# Patient Record
Sex: Male | Born: 1955 | Hispanic: Yes | Marital: Married
Health system: Western US, Academic
[De-identification: ages and names within clinical notes are randomized; demographics above are authoritative.]

---

## 2016-07-03 LAB — HLA ANTIBODY - CLASS II SINGLE

## 2016-07-03 LAB — HLA ANTIBODY - CLASS I SINGLE

## 2016-07-03 LAB — HLA ANTIBODY REPORT

## 2016-09-24 LAB — HLA ANTIBODY - CLASS II SINGLE

## 2016-09-24 LAB — HLA ANTIBODY - CLASS I SINGLE

## 2016-09-24 LAB — HLA ANTIBODY REPORT

## 2016-12-11 NOTE — Progress Notes (Addendum)
Call [facility] to obtain demographics and insurance info.  Need to fax request.  Attn:Darcy [phone].  Will request MG to obtain.    Appended Date: 12/11/2016        By: Retta Diones  ~~~~~~~~~~~~~~~~~~~~~~~~~  ~~~~~~~~~~~~~~~~~~~~~~~~~  ~~~~~~~~~~    ABO:O  Points: 9   List Date 07/19/08   cPRA: A54  EPTS: 87    0MM:9   Spec.  Programs: None    Will send Triage letter.  Electronically entered by: Retta Diones on 12/11/2016 2:52:00 PM

## 2016-12-19 NOTE — Progress Notes (Signed)
Faxed triage letter to Dr. Bridgett Larsson, Donnal Moat, and sent to patient's home  address in [place] and [place].  Electronically entered by: Cindi Carbon on 12/19/2016 3:01:00 PM

## 2016-12-23 NOTE — Progress Notes (Signed)
Received call from [facility] from Dr. Lianne Moris office stating they received the  triage letter and faxed it to the unit so patient will get the letter. She  informed us that patient's phone number is currently not work  ing, but we can contact his daughter at [phone] (she does not have the  daughter's name). Will update info.  Electronically entered by: Cindi Carbon on 12/23/2016 8:50:00 AM

## 2016-12-29 LAB — HLA ANTIBODY - CLASS II SINGLE

## 2016-12-29 LAB — HLA ANTIBODY - CLASS I SINGLE

## 2016-12-29 LAB — HLA ANTIBODY REPORT

## 2017-02-24 NOTE — Progress Notes (Signed)
Verify phone #s and address is still current (for changes): Same address  and cell number only    Appended Date: 02/24/2017        By: Cindi Carbon  ~~~~~~~~~~~~~~~~~~~~~~~~~  ~~~~~~~~~~~~~~~~~~~~~~~~~  ~~~~~~~~~~    Pref. Language: Spanish    An interpreter was utilized for this encounter.  Name of interpreter:  Brayton Layman PS#[id number]  Relationship: [employer]  Language: [place] phone #s and address is still current (for changes)    Are you still interested in moving forward with organ William Sherman? Yes    Current Physician Information    PCP: Dr. Elza Rafter, [place], [place]    GI:  no colo    Nephrologists: Dr.  Randa Lynn    Dialysis Unit:  [facility], Th, Sat    Cardiologist: none    Misc. Specialist:     Recent Medical History or Changes:    Current Weight: 210 lbs. Current Height: 5'8??    Recent Hospitalizations (When / Where): none    Recent ER Visits (When Raelene Bott): none    Additional / Worsening Dx or Symptoms:    Current Insurance (Company, ID#, Group #): Medicare, Medi-cal    Email Address: none    Date/Time of Phone Consult with Rollene Fare: 02/25/17 @ 9 am    Start Collecting Records:      Electronically entered by: Cindi Carbon on 02/24/2017 3:07:00 PM

## 2017-02-25 NOTE — Progress Notes (Signed)
Using [employer] interpreter Lollie Marrow [phone] spoke to pt to  update/review med hx.  Pt was not very knowledgeable of med hx.  He stated  he ??Almost had a stroke last month.??  He denied being hospita  lized for CVA.  He denied residual from CVA.  Denied any hospitalizations  in past 2-3 yrs.  He stated he is not very active due to decreased vision  and foot pain.  He stated he started PT  about 1 month ago for  foot pain. He goes to a PT center 1x/week.  Explained will change status to  Inactive due to temp too sick until records are retrieved and reviewed to  determine disposition.   Explained that his disposition cou  ld go 1 of 3 ways: 1) Re-See appt to determine functionality/disposition,  2) Obtain updated testing (DDWL eval testing) then Re-See appt or 3) Pt no  longer Lorrinda Ramstad candidate and will be removed from list. Ex  plained will retrieve/review med records to determine disposition. At same  time will have insurance verified/update. Explained if Medicare primary  will need to do DDWL eval testing at [facility]. He verbalized unders  tanding of info discussed.  Plan: Records retrieval/review, memo data center to change status to  Inactive due to code #7, temp too sick, draft Inactive letter.      ABO: O   Points: 9     cPRA: A54     EPTS: 57      0MM: 9   List Date: 07/19/08      Special Program:   None   HEight: 5'8??  200 lbs  BMI: 30  ESRD r/t: DM II  Medicare/Medi-Cal  Dialysis:  FDOD: 07/19/08  _X__Hemo     ___PD  - Access: A-V fistula  RUE  - Issues with infections: Denies  - Issues with Wt gain: Denies     CV:  __HTN: Denies  Cardiologist: Denies  __ CH: Denies                                                __Echo:  Unknown  __ High Cholesterol: Unsure      __ Stress: ??  __MI/CVA: Denies MI/CABG/CP/Stentitng  ? CVA last month, denies residual  __ Cardiac cath: ??    Pulm: __ COPD:Denies  __ SOB: Only in enclosed places   __ PPD+/TB: Denies  __ URIs: Denies    GI: __ GERD: Denies        __  Colonoscopy: Never  __ Chronic Constipation/Diarrhea: Denies  GU: __ Freq UTIs:Denies       __ Stones: Denies  __ Urine output: Small amt  __ Prostate issues: Denies  __ CT Abd/Pelvic: Unsure    Endo: __ DM: Diagnosed 1992          - Diet/Oral/Insulin: Currently pills and Insulin   - Vision: Blind in 1 eye, laser surgery other eye  - Neuropathy: In feet  - Gastroparesis: Denies    Malignancy history:  Denies    Psych:  Denies psychiatrist/therapist/counselor  __ Anxiety/Depression: Denies  __ Medications: Denies    Functional Status:   __ Distance able to walk: 20 minutes 2-3x/week  - Pain in legs/Claudication: In feet  - SOB: Denies  __ Activity level: Not very active due decreased vision  - Employed: Disability, Artist  -  Daily exercise: has PT x1/week  - Housework: Unable to do due to decreased vision  __ Assistive device: cane    Substance Use:    __ Tobacco: Never  __ ETOH: Not since starting dialysis  __ Marijuana/Cocaine/IV drug use: Denies      Support:  __ Single/Married: Married  __ Children: 5 ages 45-27  __ Living arrangements: With wife and children  __ Transportation: Several family members for transportation and back up  support    Health Status:  __ Recent hospitalizations: Denies any in past 2-3 yrs  __ Surgical history: A-V fistula insertion, head injury     Medications:   Allopurinol  Insulin  Lipitor  ASA 81 mg  Renvela        Plan of Care:  __ Medical records request: Harlene Salts to obtain records  __ Further testing/procedures (letter)Draft Inactive letter   __ Disposition: Records retrieval/review, memo data center to change status  to Inactive due to code #7, temp too sick, draft Inactive letter.    Electronically entered by: Retta Diones on 02/25/2017 3:22:00 PM

## 2017-02-25 NOTE — Progress Notes (Signed)
Updated status in [place] in inactive.  Electronically entered by: Berlinda Last on 02/25/2017 3:33:00 PM

## 2017-02-25 NOTE — Progress Notes (Signed)
Please change status to Inactive code #7, temp too sick.  Thanks  Electronically entered by: Retta Diones on 02/25/2017 3:28:00 PM

## 2017-03-04 NOTE — Progress Notes (Signed)
Obtain D/C summaries and ED progress notes from [facility]  from the past 3 years including cxr, ekg, and brain CT.  Obtained dialysis  flow sheets and current labs, uploaded in [place]. Notified RR  G.     Electronically entered by: Cindi Carbon on 03/04/2017 12:44:00 PM

## 2017-03-10 NOTE — Progress Notes (Signed)
Reviewed:  D/C summaries and ED progress notes from [facility]   CXR  EKG  Brain CT 11/09/16   DXU flow sheets and labs.   Have requested MRI Brain 11/06/16.    Plan: Review with Dr. Titus Dubin to determine disposition.     Electronically entered by: Retta Diones on 03/10/2017 3:20:00 PM

## 2017-03-17 NOTE — Progress Notes (Signed)
Reviewed: brain MRIs and neck MRI from 11/04/16 and brain CT from 10/31/16.    Plan: Review with Dr. Titus Dubin to determine disposition.  Electronically entered by: Retta Diones on 03/17/2017 9:38:00 AM

## 2017-03-17 NOTE — Progress Notes (Signed)
Uploaded brain MRIs and neck MRI from 11/04/16 and brain CT from 10/31/16.  Per medical records, they sent all the MRIs they had during the admission.  Notified RRG.  Electronically entered by: Cindi Carbon on 03/17/2017 9:22:00 AM

## 2017-04-02 NOTE — Progress Notes (Signed)
Reviewed case with Dr. Titus Dubin.  Reviewed D/C summary 11/06/16: CVA thrombosis R middle cerebral artery MRI:  Subacute Thrombosis R middle cerebral artery, Neck MRI: Possible stenosis  505 ICA. Pt has PT outpt 1x/week.  Pt not very active due to  decreased vision and foot pain.  Per Dr. Titus Dubin pt needs Re-See appt to  evaluate functionality and overall candidacy.    Plan: Call pt to inform need for Re-See appt and pass to primary  coordinator to arrange Re-See appt.  Electronically entered by: Retta Diones on 04/02/2017 3:13:00 PM
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
## 2018-09-16 NOTE — Progress Notes (Signed)
Able to speak with dialysis unit social worker, Acupuncturist. Informed Human resources officer reviewed patient's records and requested updates regarding patient's  interest in transplant. Per Salomon Fick, patient is still  interested. Patient is adherent to dialysis treatment and continues to have  a strong family support through his wife and children. Salomon Fick mentioned that  he patient is still able to ambulate with out assistance.  Confirmed that number on file is still current.    Contacted patient's number and spoke with [facility], patient's son, who reported that patient is not home at this time. Requested for [facility] for patient to call writer back  contact information communicated  to [facility].    An interpreter was utilized for this encounter.  Name of interpreter: Inge Rise #[id number]  Relationship: Phone interpreter  Language: Spanish    PLAN: Await for call back from the patient to complete phone triage  appointment.  Electronically entered by: [name] on 09/16/2018 10:45:00 AM

## 2019-03-21 NOTE — Progress Notes (Addendum)
Late entry from 03/23/2019:    Attempted to contact patient via listed number and reached their voicemail.  Message was left requesting for a call back to complete triage call.    Appended Date: 03/24/2019 08:08 AM        By: [name]  ~~~~~~~~~~~~~~~~~~~~~~~~~  ~~~~~~~~~~~~~~~~~~~~~~~~~  ~~~~~~~~~~    Contacted the patient via listed number and spoke with patient's son, William Sherman. Informed him that writer is calling in regards to his dad and if  patient continues to be interested in transplantation. [name] verbalized that the patient is still interested and agreed to complete  telephone triage interview on Wednesday, 03/23/19, at 2pm. Requested for  patient to be present on the call as well as family member  who is most familiar with patient's medical appointments and information.  Writer will have Spanish interpreter present during the call.    PLAN: Triage call on Wednesday, 03/23/19 at 2pm.  Electronically entered by: [name] on 03/21/2019 4:11:00 PM

## 2019-11-21 DIAGNOSIS — I69998 Other sequelae following unspecified cerebrovascular disease: Secondary | ICD-10-CM

## 2019-11-21 DIAGNOSIS — M868X8 Other osteomyelitis, other site: Secondary | ICD-10-CM

## 2019-11-21 DIAGNOSIS — Z9911 Dependence on respirator [ventilator] status: Secondary | ICD-10-CM

## 2019-11-21 DIAGNOSIS — Z992 Dependence on renal dialysis: Secondary | ICD-10-CM

## 2019-11-21 DIAGNOSIS — I120 Hypertensive chronic kidney disease with stage 5 chronic kidney disease or end stage renal disease: Secondary | ICD-10-CM

## 2019-11-21 DIAGNOSIS — H547 Unspecified visual loss: Secondary | ICD-10-CM

## 2019-11-21 DIAGNOSIS — Z20822 Contact with and (suspected) exposure to covid-19: Secondary | ICD-10-CM

## 2019-11-21 DIAGNOSIS — I953 Hypotension of hemodialysis: Secondary | ICD-10-CM

## 2019-11-21 DIAGNOSIS — Z515 Encounter for palliative care: Secondary | ICD-10-CM

## 2019-11-21 DIAGNOSIS — E1169 Type 2 diabetes mellitus with other specified complication: Secondary | ICD-10-CM

## 2019-11-21 DIAGNOSIS — K148 Other diseases of tongue: Secondary | ICD-10-CM

## 2019-11-21 DIAGNOSIS — R040 Epistaxis: Secondary | ICD-10-CM

## 2019-11-21 DIAGNOSIS — Z79899 Other long term (current) drug therapy: Secondary | ICD-10-CM

## 2019-11-21 DIAGNOSIS — C770 Secondary and unspecified malignant neoplasm of lymph nodes of head, face and neck: Secondary | ICD-10-CM

## 2019-11-21 DIAGNOSIS — Z4682 Encounter for fitting and adjustment of non-vascular catheter: Secondary | ICD-10-CM

## 2019-11-21 DIAGNOSIS — J9811 Atelectasis: Secondary | ICD-10-CM

## 2019-11-21 DIAGNOSIS — E785 Hyperlipidemia, unspecified: Secondary | ICD-10-CM

## 2019-11-21 DIAGNOSIS — D62 Acute posthemorrhagic anemia: Secondary | ICD-10-CM

## 2019-11-21 DIAGNOSIS — E8770 Fluid overload, unspecified: Secondary | ICD-10-CM

## 2019-11-21 DIAGNOSIS — E1122 Type 2 diabetes mellitus with diabetic chronic kidney disease: Secondary | ICD-10-CM

## 2019-11-21 DIAGNOSIS — D491 Neoplasm of unspecified behavior of respiratory system: Secondary | ICD-10-CM

## 2019-11-21 DIAGNOSIS — R5383 Other fatigue: Secondary | ICD-10-CM

## 2019-11-21 DIAGNOSIS — E875 Hyperkalemia: Secondary | ICD-10-CM

## 2019-11-21 DIAGNOSIS — G510 Bell's palsy: Secondary | ICD-10-CM

## 2019-11-21 DIAGNOSIS — H9190 Unspecified hearing loss, unspecified ear: Secondary | ICD-10-CM

## 2019-11-21 DIAGNOSIS — Z7984 Long term (current) use of oral hypoglycemic drugs: Secondary | ICD-10-CM

## 2019-11-21 DIAGNOSIS — K219 Gastro-esophageal reflux disease without esophagitis: Secondary | ICD-10-CM

## 2019-11-21 DIAGNOSIS — F419 Anxiety disorder, unspecified: Secondary | ICD-10-CM

## 2019-11-21 MED ORDER — WHITE PETROLATUM-MINERAL OIL EYE OINTMENT (~~LOC~~ WRAPPER)
OPHTHALMIC | Status: DC
  Administered 2019-11-22 – 2019-11-29 (×20): 1 via OPHTHALMIC

## 2019-11-21 MED ORDER — PROPOFOL 10 MG/ML INTRAVENOUS EMULSION: 10 mg/mL | INTRAVENOUS | Status: DC

## 2019-11-21 MED ORDER — PROPOFOL 10 MG/ML INTRAVENOUS EMULSION
10 mg/mL | INTRAVENOUS | Status: DC
  Administered 2019-11-22 (×3): 30 ug/kg/min via INTRAVENOUS
  Administered 2019-11-23: 08:00:00 10 ug/kg/min via INTRAVENOUS
  Administered 2019-11-24: 15 ug/kg/min via INTRAVENOUS
  Administered 2019-11-24: 12:00:00 5 ug/kg/min via INTRAVENOUS
  Administered 2019-11-25: 17:00:00 50 ug/kg/min via INTRAVENOUS
  Administered 2019-11-25: 02:00:00 20 ug/kg/min via INTRAVENOUS
  Administered 2019-11-25: 22:00:00 30 ug/kg/min via INTRAVENOUS
  Administered 2019-11-25: 09:00:00 20 ug/kg/min via INTRAVENOUS
  Administered 2019-11-26: 07:00:00 40 ug/kg/min via INTRAVENOUS
  Administered 2019-11-26 – 2019-11-28 (×3): 30 ug/kg/min via INTRAVENOUS
  Administered 2019-11-28 (×2): 40 ug/kg/min via INTRAVENOUS
  Administered 2019-11-29 – 2019-11-30 (×5): 20 ug/kg/min via INTRAVENOUS
  Administered 2019-11-30: 17:00:00 30 ug/kg/min via INTRAVENOUS
  Administered 2019-11-30: 03:00:00 20 ug/kg/min via INTRAVENOUS

## 2019-11-21 MED ORDER — FENTANYL (PF) 10 MCG/ML IN 0.9 % SODIUM CHLORIDE INTRAVENOUS
2,500 mcg/250 mL (10 mcg/mL) | INTRAVENOUS | Status: DC
  Administered 2019-11-22: 06:00:00 100 ug/h via INTRAVENOUS

## 2019-11-21 MED ORDER — PROPOFOL 10 MG/ML INTRAVENOUS EMULSION
10 | INTRAVENOUS | Status: AC
Start: 2019-11-21 — End: 2019-11-21
  Administered 2019-11-22: 05:00:00 5 via INTRAVENOUS

## 2019-11-21 MED ORDER — SODIUM CHLORIDE 0.9 % (FLUSH) INJECTION SYRINGE
0.9 % | INTRAMUSCULAR | Status: DC | PRN
  Administered 2019-12-04: 11:00:00 3 mL via INTRAVENOUS

## 2019-11-21 MED ORDER — LIDOCAINE (PF) 10 MG/ML (1 %) INJECTION SOLUTION: 10 mg/mL (1 %) | INTRAMUSCULAR | Status: DC | PRN

## 2019-11-21 MED ORDER — SODIUM CHLORIDE 0.9 % (FLUSH) INJECTION SYRINGE
0.9 % | INTRAMUSCULAR | Status: DC
  Administered 2019-11-22 – 2019-12-05 (×37): 3 mL via INTRAVENOUS

## 2019-11-21 NOTE — ED Notes (Signed)
CU with Helene Kelp, RN.  Requested repeat ABG per Marlowe Sax, ICU triage fellow.   11/21/19 1637   Pre Arrival Vitals   Pre-Hospital Pulse 80   Pre-Hospital BP 162/74   Pre-Hospital Resp 18   Pre-Hospital SpO2 100 %   FiO2 (%) 60 %   Pre-arrival ventilator settings   Vt (Set, mL) 420 mL   PEEP/CPAP (cm H2O) 7 cm H20   Pre Arrival Lines and Drains   Pre Arrival Vascular Access (Does not create LDA on flowsheet)   (corrected R/L EJ)   Pre Arrival Labs   Hemoglobin - Pre Arrival Lab 9   Hematocrit - Pre Arrival Lab 26.7

## 2019-11-21 NOTE — ED Notes (Signed)
Attempted clinical screen. RN not available, obtained initial data from referring facility documentation, MD report, and covering RN able to provide VS. Requested call back for updated clinical screen.     11/21/19 1259   Pre Arrival Vitals   Pre-Arrival Temp 37 C (98.6 F)   Pre-Hospital Pulse 78   Pre-Hospital BP 180/82   Pre-Hospital Resp 18   Pre-Hospital SpO2 100 %   O2 Device ETT   FiO2 (%) 80 %   Pre-Hospital Weight 79.3 kg (174 lb 13.2 oz)   Pain Level Unable to Assess (Comment)   Skin Color/Condition   Skin Color/Condition (WDL) X   Skin Color Pale   Pre arrival neuro status   Neuro (WDL) X  (HOH, blindness at baseline)   Level of Consciousness Other (Comment);Obtunded  (mentally obtunded upon arrival, now intubated)   Orientation Level Other (Comment)   Cognition UTA   Speech UTA;Other (Comment)  (non-verbal at baseline)   Best Verbal Response 1.   L Pupil Reaction Brisk  (PERRL, EOM intact per report)   R Pupil Reaction Brisk   RUE Motor Response (best voluntary) 1   LUE Motor Response (best voluntary) 1   RLE Motor Response (best voluntary) 1   LLE Motor Response (best voluntary) 1   Pre arrival respiratory assessment   Breathing Assisted   Pre-arrival ventilator settings   Is patient intubated? Yes   Vent Mode Assist control   Resp Rate (Set) 18   Vt (Set, mL) 450 mL   PEEP/CPAP (cm H2O) 8 cm H20   Pre Arrival Lines and Drains   Pre Arrival Vascular Access (Does not create LDA on flowsheet) Other (comment)  (A/V fistula x2, one fuctioning, 7.5 cm anterior posterior rapid Rhino in each nostril)   Pre Arrival Drains (does not create LDA on flowsheet) NG/OG tube   Pre Arrival Labs   Sodium - Pre Arrival Lab 138   Potassium- Pre Arrival Lab 3.9   Chloride - Pre Arrival Lab 93   CO2 - Pre Arrival Lab 31   BUN - Pre Arrival Lab 26   Creatinine - Pre Arrival Lab 6.3   Glucose - Pre Arrival Lab 158   Calcium - Pre Arrival Lab 10.3   Hemoglobin - Pre Arrival Lab 9.3   Hematocrit - Pre Arrival Lab 28.1   WBC-Pre  Arrival Lab 9.7   Platelets -Pre Arrival Lab 298   PT/INR -Pre Arrival Lab 13.5/1.3   AST - Pre Arrival Lab 16   ALT - Pre Arrival Lab 13   Bilirubin - Pre Arrival Lab 1.1   Alkaline Phosphate - Pre Arrival Lab 47   ABG - Pre Arrival Lab 7.46/51/61/36.3/11.2/96.6 on 100% Fio2   Troponin - Pre Arrival Lab 0.02   Anion Gap - Pre Arrival Lab 14   Pre Arrival Additional Info   Additonal Information COVID not detected   Allergies NKDA

## 2019-11-21 NOTE — ED Notes (Signed)
INITIAL ASSESSMENT OBTAINED FROM REFERRING HOSPITAL'S CLINICIAN(S)        Name of Clinician providing clinical information: Enid Derry, RN  Referring Facility: [facility] ED  Accepting Diagnosis: High Volume Epistaxis  Treatment Plan: IR consult, eval and treat  FRE:[id number] with severe nose bleed, started overnight, pt became lethargic unable to manage airway, was intubated  PMH: ESRD on HD, last Friday; taking eliquis for stroke years ago, blind, HOH, wheelchair bound, HTN    COVID: Asymptomatic, Tested Negative within last 96 hours at referring facility.  COVID results: Scanned into clinical documents.    Pre Arrival Additional Info  Additonal Information: COVID not detecteed  Allergies: NKDA  Devices/therapies/lines:PIV, OGT, Rhino rockets b/l nares  Mental Status:   Other  Pre arrival neuro status  Neuro (WDL): Exceptions to WDL (HOH, blindness at baseline)  Level of Consciousness: (P) Other (Comment), Obtunded (alert upon arrival then sedated for intubation, responded to painful stimuli during sedation-vacation)  Orientation Level: (P) Unable to assess  Cognition: Unable to assess  Speech: Unable to assess, Other (Comment) (non-verbal at baseline)  Best Eye Response: (P) None  Best Verbal Response: ETT/Trach tube present  L Pupil Reaction: Brisk (PERRL, EOM intact per report)  R Pupil Reaction: Brisk  RUE Motor Response (best voluntary): (P) Movement against gravity and some resistance  LUE Motor Response (best voluntary): (P) Movement against gravity and some resistance  RLE Motor Response (best voluntary): (P) Movement against gravity and some resistance  LLE Motor Response (best voluntary): (P) Movement against gravity and some resistance  Able to Ambulate?: (P) No (wheelchair at baseline per family)  Pre Arrival Vitals  Pre-Arrival Temp: 37 C (98.6 F)  Pre-Hospital Pulse: 78  Cardiac Rhythm: (P) Normal sinus rhythm  Pre-Hospital BP: (P) 173/79 (1 inch Nitropaste present)  Pre-Hospital Resp: 18   Pre-Hospital SpO2: 100 %  O2 Device: ETT  FiO2 (%): 80 %  Pre-Hospital Weight: 79.3 kg (174 lb 13.2 oz)  Pain Level: (P) Unable to Assess (Comment)     Pre arrival respiratory assessment  Breathing: Assisted  O2 Device: ETT  Additional Isolation Needs: (P)  (None)  Intubated: Pre-arrival ventilator settings  Is patient intubated?: Yes  Vent Mode: Assist control  FiO2 (%): 80 %  Resp Rate (Set): 18  Vt (Set, mL): 450 mL  PEEP/CPAP (cm H2O): (P) 6.5 cm H20    Infusions: Pre Arrival Medication Infusions  FentaNYL (SUBLIMAZE) Infusion: (P) _0 /hr  Propofol (DIPRIVAN) Infusion: (P) _1  mcg/kg/min  Other Medication Gtt #1: (P) received 1000 units of k-centra  Other Medication Gtt #2: (P) HX: eliquis- last 11/20/19 am  Is the patient currently on an IV/SQ Prostacyclin for Pulm HTN? ie. IV Epoprostenol (Flolan), IV Epoprostenol (Veletri), IV or SQ Treprostinil (Remodulin) : (P) No         Pre Arrival Labs  Sodium - Pre Arrival Lab: 138  Potassium- Pre Arrival Lab: 3.9  Chloride - Pre Arrival Lab: 93  CO2 - Pre Arrival Lab: 31  BUN - Pre Arrival Lab: 26  Creatinine - Pre Arrival Lab: 6.3  Glucose - Pre Arrival Lab: 158  Calcium - Pre Arrival Lab: 10.3  Hemoglobin - Pre Arrival Lab: 9.3  Hematocrit - Pre Arrival Lab: 28.1  WBC-Pre Arrival Lab: 9.7  Platelets -Pre Arrival Lab: 298  PT/INR -Pre Arrival Lab: 13.5/1.3  AST - Pre Arrival Lab: 16  ALT - Pre Arrival Lab: 13  Bilirubin - Pre Arrival Lab: 1.1  Alkaline Phosphate - Pre Arrival Lab: 47  ABG - Pre Arrival Lab: 7.46/51/61/36.3/11.2/96.6 on 100% Fio2  Troponin - Pre Arrival Lab: 0.02  Anion Gap - Pre Arrival Lab: 14  Other Lab Values-Pre Arrival Lab: (P) 1 unit blood given          11/21/19 1337   Pre Arrival Vitals   Cardiac Rhythm NSR   Pre-Hospital BP 173/79  (1 inch Nitropaste present)   Pain Level Unable to Assess (Comment)   Skin Color/Condition   Skin Condition/Temp Dry;Edematous;Other (Comment)  (scars on abdomen)   Pre arrival neuro status   Level of Consciousness  Other (Comment);Obtunded  (alert upon arrival then sedated for intubation, responded to painful stimuli during sedation-vacation)   Orientation Level UTA   Best Eye Response 1   RUE Motor Response (best voluntary) 4   LUE Motor Response (best voluntary) 4   RLE Motor Response (best voluntary) 4   LLE Motor Response (best voluntary) 4   Able to Ambulate? No  (wheelchair at baseline per family)   Pre-arrival ventilator settings   PEEP/CPAP (cm H2O) 6.5 cm H20   Pre Arrival Gastrointestinal Assessment   Gastrointestinal (WDL) X   Nausea N   Vomiting N   Diarrhea N   Pre Arrival Genitourinary Assessment   Genitourinary (WDL) X   Genitourinary Symptoms Anuric   Pre Arrival Lines and Drains   Pre Arrival Vascular Access (Does not create LDA on flowsheet) Peripheral IV  (functioning A/V fistula R upper arm, last HD Friday. PIV 18g B/L IJ)   Pre Arrival Drains (does not create LDA on flowsheet) NG/OG tube  (417m output dark red blood from OG since placement)   Pre Arrival Medication Infusions   FentaNYL (SUBLIMAZE) Infusion _0 /hr   Propofol (DIPRIVAN) Infusion _1  mcg/kg/min   Other Medication Gtt #1 received 1000 units of k-centra   Other Medication Gtt #2 HX: eliquis- last 11/20/19 am   Is the patient currently on an IV/SQ Prostacyclin for Pulm HTN? ie. IV Epoprostenol (Flolan), IV Epoprostenol (Veletri), IV or SQ Treprostinil (Remodulin)  No   C. Difficile Risk Assessment   Additional Isolation Needs   (None)   Pre Arrival Labs   Other Lab Values-Pre Arrival Lab 1 unit blood given

## 2019-11-21 NOTE — Consults (Signed)
OSH request  64 yo male with OSH ED, intubated for hight volume epistaxis (bilateral rhinorocket nasal packing ) h/h droped from 9-7, 1 unit, BP (high now, needs dialysis-last dialysis unknown) and HR stable, out pt (dr. 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 Imaging right nasal mass, right masticator space and into hypopharynx extension, vascular encasement  CT brain without contrast, CT angio head and neck with contrast.     Previous MRI and CT earlier in the month. Unable to upload any imaging due to limitations in system.     [place], [facility] ED provider currently taking care of pt at OSH ED: [phone].     Sign-out:   Stable, no more bleeding, packed bilaterally  Covid status negative.   When last dialysis- MWF, last know un  Imaging requested disk      Plan to accept to ICU given intubation status as long as beds. Will need CT-A with coordinate dialysis and possible neuro IR vs OR with [facility] (possible to do bedside biopsy pending exam). I spoke with Dr. Joneen Caraway, Christeen Douglas and Dr. Kenton Kingfisher about the plan.

## 2019-11-21 NOTE — Consults (Signed)
OTOLARYNGOLOGY - HEAD AND NECK SURGERY  History and Physical/Consult Note    Chief Complaint: high volume epistaxis    Requesting Provider: Donivan Scull MD    Requesting Service: ICU    History of Present Illness: William Sherman is a 64 y.o. 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 H/H dropped from 9 to 7 reported. CT brain wihtout contrast and CTA H&N obtained at OSH which notes right nasal mass with extension into the right masticator space and hypopharynx with vascular involvement. Outpt ENT is Dr. Landis Gandy. Reportedly blind and hard of hearing at baseline.     Review of Systems:  Unable to obtain complete ROS due to patient's acuity of condition.     --- For other relevant health data, including Allergies, Home medications, and Health history, please see addendum of note ---    Objective:  BP (!) 232/59   Pulse 83   Temp 37.7 C (99.9 F) (Axillary)   Resp 16   Ht 172.7 cm (5' 8")   Wt (S) 84.4 kg (186 lb 1.1 oz)   SpO2 98%   BMI 28.29 kg/m      Vent settings:  Mode: CMV (06/07 2200)  RR Set: 16 (06/07 2200)  Flow Rate Set (lpm): 50 (06/07 2200)  Tidal Volume Set: 400 mL (06/07 2200)  PEEP/CPAP Set (cm H2O): 5 cm H2O (06/07 2200)  FiO2 (%): 30 % (06/07 2236)      No intake or output data in the 24 hours ending 11/21/19 2256    Physical Exam:  General: Ill appearing man, intubated  Neurological:  sedated   Cardio/Pulmonary: mechanically ventilated  HEENT:      Head/Face: NCAT, no worrisome face lesions     Eyes: left pupil and iris opaque, right pupil and iris dark brown     Ears: External ears normal-appearing     Nose: External nose normal-appearing, packed bilaterally with rhinorockets     Oral cavity: 8.0 ETT in place      Neck: thick wide neck, supple, flat, trachea midline, no  cervical or supraclavicular lymphadenopathy      Neuro: sedated    Selected Results:  I have reviewed the following laboratory data --  No results found in last 72 hours  No results found in last 72 hours    No results found for: WBC, WBCM, WCBD, HGB, HGBM, HCT, HCTM, NSH, HCT22, MCV, PLT, CCP  No results found for: NA, K, CL, CO2, BUN, CREAT, GLU  No results found for: CA, PO4  No results found for: MG  No results found for: ALT, AST, ALKP, DBILI, TBILI, ANA4, TBILWB, GGT, ANA5  No results found for: INR, PT, LABPROT, PROTIME  No results found for: PTT  No results found for: CK, MBMU, TRPI, TRIPZ  No results found for: POCTLEUK, POCTNITRITE, POCTPROTEIN, POCTPHUR, POCTRBCUR, POCTSPECGRAV, POCTKETONESU, POCTBILIUR, POCTGLUCUR      Review of Other Relevant Data:  Radiology Results  No results found.    The plan regarding Gaje Tennyson was discussed with [facility], Drs. Caron Presume and Christeen Douglas.    Assessment and Plan:  Wisdom Rickey ([id number]) - 1356/1356-12  64 y.o. 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 At this time, unable to evaluate for source of bleeding with bilateral nasal packing. Will obtain recent/new imaging and consider biopsy based on results.    Recommend  - appreciate excellent global care per primary team  - if OSH CTA head and neck with contrast cannot be uploaded into [employer], will need to obtain new imaging. Will need to coordinate hemodialysis timing with repeat CTA given contrast load  - will consider Neuro IR consult pending results/findings of CTA  - will discuss removal of nasal packing in the AM with possible bedside vs OR biopsy to evaluate nasal mass  - we have reached out to his outpatient ENT Dr. Landis Gandy for further history regarding his  prior workup and care of the nasal mass  - [facility] will continue to follow. Please alert [facility] to any acute changes in clinical status.     Juanetta Snow MD  Resident Physician, PGY-2  Otolaryngology - Head & Neck Surgery  11/21/19    Code Status:   Full Code    --- Please page/call the Otolaryngology - Head & Neck Surgery Consult service pager with any questions ---      Additional Medical History    Medications Prior to Admission:  Scheduled Meds:   0.9% sodium chloride flush  3 mL Intravenous Q8H [place]    white petrolatum-mineral oil  1 Application Both Eyes 4x Daily [place]     Continuous Infusions:   fentaNYL 100 mcg/hr (11/21/19 2240)    propofoL       PRN Meds:   0.9% sodium chloride flush  3 mL Intravenous PRN    lidocaine (PF)  5 mL Subcutaneous PRN       Allergies:  Allergies/Contraindications  Not on File    Past Medical History:  No past medical history on file.    Past Surgical History:  No past surgical history on file.    Social History:  Social History     Socioeconomic History    Marital status: Married     Spouse name: Not on file    Number of children: Not on file    Years of education: Not on file    Highest education level: Not on file   Occupational History    Not on file   Tobacco Use    Smoking status: Not on file   Substance and Sexual Activity    Alcohol use: Not on file    Drug use: Not on file    Sexual activity: Not on file   Other Topics Concern    Not on file   Social History Narrative    Not on file     Social Determinants of Health     Financial Resource Strain:     Difficulty of Paying Living Expenses:    Food Insecurity:     Worried About [place] in the Last Year:     [place] in the Last Year:    Transportation Needs:     Film/video editor (Medical):     Lack of Transportation (Non-Medical):    Physical Activity:     Days of Exercise per Week:     Minutes of Exercise per Session:    Stress:     Feeling of Stress :    Social  Connections:     Frequency of Communication with Friends and Family:  Frequency of Social Gatherings with Friends and Family:     Attends Religious Services:     Active Member of Clubs or Organizations:     Attends Music therapist:     Marital Status:    Intimate Partner Violence:     Fear of Current or Ex-Partner:     Emotionally Abused:     Physically Abused:     Sexually Abused:       Social history was reviewed and is non-contributory to this illness.    Family History:     Family history was reviewed and is non-contributory to this illness.

## 2019-11-22 ENCOUNTER — Inpatient Hospital Stay: Admit: 2019-11-22 | Discharge: 2019-11-22 | Payer: MEDICARE

## 2019-11-22 ENCOUNTER — Inpatient Hospital Stay
Admission: TF | Admit: 2019-11-22 | Discharge: 2019-12-06 | Disposition: A | Discharge status: Skilled Nursing Facility | Payer: MEDICARE | Source: Intra-hospital

## 2019-11-22 DIAGNOSIS — Z452 Encounter for adjustment and management of vascular access device: Secondary | ICD-10-CM

## 2019-11-22 DIAGNOSIS — D4989 Neoplasm of unspecified behavior of other specified sites: Secondary | ICD-10-CM

## 2019-11-22 DIAGNOSIS — Z4659 Encounter for fitting and adjustment of other gastrointestinal appliance and device: Secondary | ICD-10-CM

## 2019-11-22 DIAGNOSIS — R58 Hemorrhage, not elsewhere classified: Secondary | ICD-10-CM

## 2019-11-22 DIAGNOSIS — I10 Essential (primary) hypertension: Secondary | ICD-10-CM

## 2019-11-22 LAB — VENOUS BLOOD GAS W/LACTATE
Base excess: 10 mmol/L
Base excess: 11 mmol/L
Base excess: 7 mmol/L
Base excess: 7 mmol/L
Base excess: 9 mmol/L
Bicarbonate: 31 mmol/L — ABNORMAL HIGH (ref 23–29)
Bicarbonate: 32 mmol/L — ABNORMAL HIGH (ref 23–29)
Bicarbonate: 33 mmol/L — ABNORMAL HIGH (ref 23–29)
Bicarbonate: 33 mmol/L — ABNORMAL HIGH (ref 23–29)
Bicarbonate: 34 mmol/L — ABNORMAL HIGH (ref 23–29)
Calcium, Ionized, whole blood: 1.28 mmol/L — ABNORMAL HIGH (ref 1.13–1.27)
Calcium, Ionized, whole blood: 1.28 mmol/L — ABNORMAL HIGH (ref 1.13–1.27)
Calcium, Ionized, whole blood: 1.29 mmol/L — ABNORMAL HIGH (ref 1.13–1.27)
Calcium, Ionized, whole blood: 1.3 mmol/L — ABNORMAL HIGH (ref 1.13–1.27)
Calcium, Ionized, whole blood: 1.32 mmol/L — ABNORMAL HIGH (ref 1.13–1.27)
Chloride, whole blood: 100 mmol/L (ref 99–108)
Chloride, whole blood: 97 mmol/L — ABNORMAL LOW (ref 99–108)
Chloride, whole blood: 97 mmol/L — ABNORMAL LOW (ref 99–108)
Chloride, whole blood: 98 mmol/L — ABNORMAL LOW (ref 99–108)
Chloride, whole blood: 98 mmol/L — ABNORMAL LOW (ref 99–108)
FIO2: 30 % (ref 20–100)
FIO2: 30 % (ref 20–100)
FIO2: 50 % (ref 20–100)
FIO2: 30 % (ref 20–100)
Glucose, whole blood: 83 mg/dL (ref 70–199)
Glucose, whole blood: 84 mg/dL (ref 70–199)
Glucose, whole blood: 90 mg/dL (ref 70–199)
Glucose, whole blood: 91 mg/dL (ref 70–199)
Glucose, whole blood: 92 mg/dL (ref 70–199)
Hematocrit from Hb: 22 % — ABNORMAL LOW (ref 41–53)
Hematocrit from Hb: 22 % — ABNORMAL LOW (ref 41–53)
Hematocrit from Hb: 23 % — ABNORMAL LOW (ref 41–53)
Hematocrit from Hb: 23 % — ABNORMAL LOW (ref 41–53)
Hematocrit from Hb: 26 % — ABNORMAL LOW (ref 41–53)
Hemoglobin, Whole Blood: 7.1 g/dL — ABNORMAL LOW (ref 13.6–17.5)
Hemoglobin, Whole Blood: 7.2 g/dL — ABNORMAL LOW (ref 13.6–17.5)
Hemoglobin, Whole Blood: 7.6 g/dL — ABNORMAL LOW (ref 13.6–17.5)
Hemoglobin, Whole Blood: 7.6 g/dL — ABNORMAL LOW (ref 13.6–17.5)
Hemoglobin, Whole Blood: 8.3 g/dL — ABNORMAL LOW (ref 13.6–17.5)
Lactate, whole blood: 0.6 mmol/L (ref 0.5–2.0)
Lactate, whole blood: 0.7 mmol/L (ref 0.5–2.0)
Lactate, whole blood: 0.8 mmol/L (ref 0.5–2.0)
Lactate, whole blood: 0.9 mmol/L (ref 0.5–2.0)
Lactate, whole blood: 1 mmol/L (ref 0.5–2.0)
Oxygen Saturation: 59 % — ABNORMAL LOW (ref 95–100)
Oxygen Saturation: 66 % — ABNORMAL LOW (ref 95–100)
Oxygen Saturation: 73 % — ABNORMAL LOW (ref 95–100)
Oxygen Saturation: 73 % — ABNORMAL LOW (ref 95–100)
Oxygen Saturation: 89 % — ABNORMAL LOW (ref 95–100)
PCO2: 42 mm Hg (ref 32–46)
PCO2: 45 mm Hg (ref 32–46)
PCO2: 46 mm Hg (ref 32–46)
PCO2: 51 mm Hg — ABNORMAL HIGH (ref 32–46)
PCO2: 52 mm Hg — ABNORMAL HIGH (ref 32–46)
PO2: 32 mm Hg — CL (ref 83–108)
PO2: 38 mm Hg — CL (ref 83–108)
PO2: 38 mm Hg — CL (ref 83–108)
PO2: 39 mm Hg — CL (ref 83–108)
PO2: 59 mm Hg — ABNORMAL LOW (ref 83–108)
Potassium, Whole Blood: 3.7 mmol/L (ref 3.4–4.5)
Potassium, Whole Blood: 3.8 mmol/L (ref 3.4–4.5)
Potassium, Whole Blood: 3.9 mmol/L (ref 3.4–4.5)
Potassium, Whole Blood: 4.1 mmol/L (ref 3.4–4.5)
Potassium, Whole Blood: 4.3 mmol/L (ref 3.4–4.5)
Sodium, whole blood: 138 mmol/L (ref 136–146)
Sodium, whole blood: 138 mmol/L (ref 136–146)
Sodium, whole blood: 139 mmol/L (ref 136–146)
Sodium, whole blood: 139 mmol/L (ref 136–146)
Sodium, whole blood: 140 mmol/L (ref 136–146)
pH, Blood: 7.4 (ref 7.35–7.45)
pH, Blood: 7.4 (ref 7.35–7.45)
pH, Blood: 7.47 — ABNORMAL HIGH (ref 7.35–7.45)
pH, Blood: 7.47 — ABNORMAL HIGH (ref 7.35–7.45)
pH, Blood: 7.51 — ABNORMAL HIGH (ref 7.35–7.45)

## 2019-11-22 LAB — COVID-19 RNA, RT-PCR/NUCLEIC A: COVID-19 RNA, RT-PCR/Nucleic A: NOT DETECTED

## 2019-11-22 LAB — COMPREHENSIVE METABOLIC PANEL
AST: 11 U/L (ref 5–44)
Alanine transaminase: 9 U/L — ABNORMAL LOW (ref 10–61)
Albumin, Serum / Plasma: 2.1 g/dL — ABNORMAL LOW (ref 3.4–4.8)
Alkaline Phosphatase: 46 U/L (ref 38–108)
Anion Gap: 11 (ref 4–14)
Bilirubin, Total: 0.7 mg/dL (ref 0.2–1.2)
Calcium, total, Serum / Plasma: 9.6 mg/dL (ref 8.4–10.5)
Carbon Dioxide, Total: 28 mmol/L (ref 22–29)
Chloride, Serum / Plasma: 100 mmol/L — ABNORMAL LOW (ref 101–110)
Creatinine: 7.3 mg/dL — ABNORMAL HIGH (ref 0.73–1.24)
Glucose, non-fasting: 105 mg/dL (ref 70–199)
Potassium, Serum / Plasma: 3.9 mmol/L (ref 3.5–5.0)
Protein, Total, Serum / Plasma: 5.8 g/dL — ABNORMAL LOW (ref 6.3–8.6)
Sodium, Serum / Plasma: 139 mmol/L (ref 135–145)
Urea Nitrogen, Serum / Plasma: 38 mg/dL — ABNORMAL HIGH (ref 7–25)
eGFR - high estimate: 8 mL/min — ABNORMAL LOW (ref >59)
eGFR - low estimate: 7 mL/min — ABNORMAL LOW (ref >59)

## 2019-11-22 LAB — COMPLETE BLOOD COUNT
Hematocrit: 26.1 % — ABNORMAL LOW (ref 41.0–53.0)
Hematocrit: 24.9 % — ABNORMAL LOW (ref 41.0–53.0)
Hematocrit: 23.3 % — ABNORMAL LOW (ref 41.0–53.0)
Hemoglobin: 8.2 g/dL — ABNORMAL LOW (ref 13.6–17.5)
Hemoglobin: 7.9 g/dL — ABNORMAL LOW (ref 13.6–17.5)
Hemoglobin: 7.3 g/dL — ABNORMAL LOW (ref 13.6–17.5)
MCH: 28.4 pg (ref 26.0–34.0)
MCH: 28.7 pg (ref 26.0–34.0)
MCH: 28.6 pg (ref 26.0–34.0)
MCHC: 31.4 g/dL (ref 31.0–36.0)
MCHC: 31.7 g/dL (ref 31.0–36.0)
MCHC: 31.3 g/dL (ref 31.0–36.0)
MCV: 90 fL (ref 80–100)
MCV: 91 fL (ref 80–100)
MCV: 91 fL (ref 80–100)
Platelet Count: 276 10*9/L (ref 140–450)
Platelet Count: 256 10*9/L (ref 140–450)
Platelet Count: 243 10*9/L (ref 140–450)
RBC Count: 2.89 10*12/L — ABNORMAL LOW (ref 4.40–5.90)
RBC Count: 2.75 10*12/L — ABNORMAL LOW (ref 4.40–5.90)
RBC Count: 2.55 10*12/L — ABNORMAL LOW (ref 4.40–5.90)
WBC Count: 11.5 10*9/L — ABNORMAL HIGH (ref 3.4–10.0)
WBC Count: 12 10*9/L — ABNORMAL HIGH (ref 3.4–10.0)
WBC Count: 12.9 10*9/L — ABNORMAL HIGH (ref 3.4–10.0)

## 2019-11-22 LAB — BASIC METABOLIC PANEL (NA, K,
Anion Gap: 10 (ref 4–14)
Calcium, total, Serum / Plasma: 9.7 mg/dL (ref 8.4–10.5)
Carbon Dioxide, Total: 29 mmol/L (ref 22–29)
Chloride, Serum / Plasma: 101 mmol/L (ref 101–110)
Creatinine: 7.16 mg/dL — ABNORMAL HIGH (ref 0.73–1.24)
Glucose, non-fasting: 97 mg/dL (ref 70–199)
Potassium, Serum / Plasma: 4.2 mmol/L (ref 3.5–5.0)
Sodium, Serum / Plasma: 140 mmol/L (ref 135–145)
Urea Nitrogen, Serum / Plasma: 36 mg/dL — ABNORMAL HIGH (ref 7–25)
eGFR - high estimate: 9 mL/min — ABNORMAL LOW (ref >59)
eGFR - low estimate: 7 mL/min — ABNORMAL LOW (ref >59)

## 2019-11-22 LAB — POCT GLUCOSE
Glucose, iSTAT: 78 mg/dL (ref 70–199)
Glucose, iSTAT: 110 mg/dL (ref 70–199)
Glucose, iSTAT: 102 mg/dL (ref 70–199)
Glucose, iSTAT: 94 mg/dL (ref 70–199)
Glucose, iSTAT: 100 mg/dL (ref 70–199)

## 2019-11-22 LAB — MAGNESIUM, SERUM / PLASMA: Magnesium, Serum / Plasma: 2 mg/dL (ref 1.6–2.6)

## 2019-11-22 LAB — PROTHROMBIN TIME
Int'l Normaliz Ratio: 1.4 — ABNORMAL HIGH (ref 0.9–1.2)
PT: 16.5 s — ABNORMAL HIGH (ref 11.7–14.9)

## 2019-11-22 LAB — ACTIVATED PARTIAL THROMBOPLAST: Activated Partial Thromboplast: 33.4 s — ABNORMAL HIGH (ref 20.9–30.9)

## 2019-11-22 LAB — BILIRUBIN, TOTAL: Bilirubin, Total: 0.7 mg/dL (ref 0.2–1.2)

## 2019-11-22 LAB — TRIGLYCERIDES, SERUM: Triglycerides, serum: 117 mg/dL (ref <200)

## 2019-11-22 LAB — ALANINE TRANSAMINASE: Alanine transaminase: 8 U/L — ABNORMAL LOW (ref 10–61)

## 2019-11-22 LAB — ALKALINE PHOSPHATASE: Alkaline Phosphatase: 47 U/L (ref 38–108)

## 2019-11-22 LAB — PHOSPHORUS, SERUM / PLASMA: Phosphorus, Serum / Plasma: 3.4 mg/dL (ref 2.3–4.7)

## 2019-11-22 LAB — ASPARTATE TRANSAMINASE: AST: 14 U/L (ref 5–44)

## 2019-11-22 MED ORDER — VITAMIN B COMPLEX-VITAMIN C-FOLIC ACID 0.8 MG TABLET
0.8 | Freq: Every day | ORAL | Status: DC
Start: 2019-11-22 — End: 2019-12-02
  Administered 2019-11-23 – 2019-12-01 (×9): 1 via OROGASTRIC

## 2019-11-22 MED ORDER — ACETAMINOPHEN 1,000 MG/100 ML (10 MG/ML) INTRAVENOUS SOLUTION (ONCE)
1,000 mg/100 mL (10 mg/mL) | INTRAVENOUS | Status: AC
  Administered 2019-11-22: 15:00:00 1000 mg via INTRAVENOUS

## 2019-11-22 MED ORDER — FAMOTIDINE (PF) 20 MG/2 ML INTRAVENOUS SOLUTION
20 mg/2 mL | INTRAVENOUS | Status: DC
  Administered 2019-11-22: 09:00:00 10 mg via INTRAVENOUS

## 2019-11-22 MED ORDER — LIDOCAINE 4 % TOPICAL CREAM
4 | Freq: Once | TOPICAL | Status: DC | PRN
Start: 2019-11-22 — End: 2019-11-22

## 2019-11-22 MED ORDER — HYDRALAZINE 50 MG TABLET
50 | ORAL | Status: DC
Start: 2019-11-22 — End: 2019-12-02

## 2019-11-22 MED ORDER — SODIUM CHLORIDE 0.9 % DIALYSIS CIRCUIT
0.9 | Freq: Once | INTRAVENOUS | Status: AC
Start: 2019-11-22 — End: 2019-11-22
  Administered 2019-11-23: 02:00:00 200 mL via INTRAVENOUS

## 2019-11-22 MED ORDER — DARBEPOETIN ALFA 100 MCG/ML IN POLYSORBATE INJECTION
100 | Freq: Once | INTRAMUSCULAR | Status: AC
Start: 2019-11-22 — End: 2019-11-22
  Administered 2019-11-23: 04:00:00 100 ug via INTRAVENOUS

## 2019-11-22 MED ORDER — ATORVASTATIN 40 MG TABLET
40 | ORAL | Status: DC
Start: 2019-11-22 — End: 2019-12-02

## 2019-11-22 MED ORDER — AMLODIPINE 10 MG TABLET
10 | ORAL | Status: DC
Start: 2019-11-22 — End: 2019-12-02

## 2019-11-22 MED ORDER — INSULIN ASPART 100 UNIT/ML SUBCUTANEOUS PEN (CONTINUOUS TPN/TF/NPO)
100 unit/mL | SUBCUTANEOUS | Status: DC
  Administered 2019-11-23 – 2019-11-25 (×3): 0 [IU] via SUBCUTANEOUS
  Administered 2019-11-27: 18:00:00 2 [IU] via SUBCUTANEOUS
  Administered 2019-11-27 (×2): 1 [IU] via SUBCUTANEOUS
  Administered 2019-11-27: 21:00:00 3 [IU] via SUBCUTANEOUS
  Administered 2019-11-27: 13:00:00 1 [IU] via SUBCUTANEOUS
  Administered 2019-11-28 (×2): 0 [IU] via SUBCUTANEOUS
  Administered 2019-11-28: 05:00:00 3 [IU] via SUBCUTANEOUS
  Administered 2019-11-28: 12:00:00 1 [IU] via SUBCUTANEOUS
  Administered 2019-11-28 (×2): 3 [IU] via SUBCUTANEOUS
  Administered 2019-11-29 (×4): 2 [IU] via SUBCUTANEOUS
  Administered 2019-11-29: 01:00:00 1 [IU] via SUBCUTANEOUS

## 2019-11-22 MED ORDER — CLONIDINE 0.2 MG/24 HR WEEKLY TRANSDERMAL PATCH
0.2 | TRANSDERMAL | Status: DC
Start: 2019-11-22 — End: 2019-11-30

## 2019-11-22 MED ORDER — ACETAMINOPHEN 500 MG TABLET
500 mg | ORAL | Status: DC
  Administered 2019-11-22 – 2019-12-01 (×27): 1000 mg via GASTROSTOMY

## 2019-11-22 MED ORDER — SODIUM CHLORIDE 0.9 % DIALYSIS CIRCUIT
0.9 | Freq: Once | INTRAVENOUS | Status: AC
Start: 2019-11-22 — End: 2019-11-22
  Administered 2019-11-23: 200 mL via INTRAVENOUS

## 2019-11-22 MED ORDER — DEXTROSE 50 % IN WATER (D50W) INTRAVENOUS SYRINGE
50% | INTRAVENOUS | Status: DC | PRN
  Administered 2019-11-22 – 2019-12-01 (×4): 6.25 g via INTRAVENOUS

## 2019-11-22 MED ORDER — LABETALOL 5 MG/ML INTRAVENOUS SOLUTION
5 mg/mL | INTRAVENOUS | Status: DC | PRN
  Administered 2019-11-22: 11:00:00 10 mg via INTRAVENOUS

## 2019-11-22 MED ORDER — GABAPENTIN 300 MG CAPSULE
300 | ORAL | Status: DC
Start: 2019-11-22 — End: 2019-12-02

## 2019-11-22 MED ORDER — SODIUM CHLORIDE 0.9 % IV BOLUS
0.9 | INTRAVENOUS | Status: DC | PRN
Start: 2019-11-22 — End: 2019-11-22

## 2019-11-22 MED ORDER — GLUCOSE 4 GRAM CHEWABLE TABLET: 4 gram | ORAL | Status: DC | PRN

## 2019-11-22 MED ORDER — DEXTROSE 10 % IN WATER (D10W) INTRAVENOUS SOLUTION
10% | INTRAVENOUS | Status: DC | PRN
  Administered 2019-11-24: 07:00:00 15 mL/h via INTRAVENOUS
  Administered 2019-11-28 – 2019-12-02 (×6): 50 mL/h via INTRAVENOUS

## 2019-11-22 MED ORDER — VITAMIN B COMPLEX-VITAMIN C-FOLIC ACID 0.8 MG TABLET
0.8 | Freq: Every day | ORAL | Status: DC
Start: 2019-11-22 — End: 2019-11-22

## 2019-11-22 MED ORDER — SODIUM CHLORIDE 0.9 % DIALYSIS CIRCUIT
0.9 | INTRAVENOUS | Status: DC | PRN
Start: 2019-11-22 — End: 2019-11-22

## 2019-11-22 MED ORDER — FENTANYL (PF) 10 MCG/ML IN 0.9 % SODIUM CHLORIDE INTRAVENOUS
2500 | INTRAVENOUS | Status: DC
Start: 2019-11-22 — End: 2019-11-26
  Administered 2019-11-22 – 2019-11-24 (×3): 100 ug/h via INTRAVENOUS
  Administered 2019-11-25: 17:00:00 50 ug/h via INTRAVENOUS

## 2019-11-22 MED ORDER — OMEPRAZOLE 40 MG CAPSULE,DELAYED RELEASE
40 | ORAL | Status: DC
Start: 2019-11-22 — End: 2019-12-02

## 2019-11-22 MED ORDER — ONDANSETRON HCL 4 MG TABLET
4 | ORAL | Status: DC | PRN
Start: 2019-11-22 — End: 2019-11-30

## 2019-11-22 MED ORDER — CARVEDILOL 12.5 MG TABLET
12.5 | ORAL | Status: DC
Start: 2019-11-22 — End: 2019-12-02

## 2019-11-22 MED ORDER — CYANOCOBALAMIN (VIT B-12) 500 MCG TABLET
500 | ORAL | Status: DC
Start: 2019-11-22 — End: 2019-11-30

## 2019-11-22 MED ORDER — NOREPINEPHRINE BITARTRATE 4 MG/250 ML (16 MCG/ML) IN 0.9 % NACL IV
4 | INTRAVENOUS | Status: DC
Start: 2019-11-22 — End: 2019-11-22

## 2019-11-22 MED ORDER — BASAGLAR KWIKPEN U-100 INSULIN 100 UNIT/ML (3 ML) SUBCUTANEOUS
100 | SUBCUTANEOUS | Status: DC
Start: 2019-11-22 — End: 2019-11-30

## 2019-11-22 MED ORDER — LABETALOL 5 MG/ML INTRAVENOUS SOLUTION
5 mg/mL | INTRAVENOUS | Status: DC | PRN
  Administered 2019-11-22: 10:00:00 5 mg via INTRAVENOUS

## 2019-11-22 MED ORDER — NOREPINEPHRINE BITARTRATE 4 MG/250 ML (16 MCG/ML) IN 0.9 % NACL IV
4 | INTRAVENOUS | Status: AC
Start: 2019-11-22 — End: 2019-11-22
  Administered 2019-11-23: 02:00:00 0.15 via INTRAVENOUS

## 2019-11-22 MED ORDER — DEXTROSE 50 % IN WATER (D50W) INTRAVENOUS SYRINGE: 50% | INTRAVENOUS | Status: DC | PRN

## 2019-11-22 MED ORDER — NOREPINEPHRINE BITARTRATE 4 MG/250 ML (16 MCG/ML) IN 0.9 % NACL IV
4 | INTRAVENOUS | Status: DC
Start: 2019-11-22 — End: 2019-11-23

## 2019-11-22 MED ORDER — LIDOCAINE (PF) 10 MG/ML (1 %) INJECTION SOLUTION
10 | INTRAMUSCULAR | Status: DC | PRN
Start: 2019-11-22 — End: 2019-11-22

## 2019-11-22 MED FILL — DIPRIVAN 10 MG/ML INTRAVENOUS EMULSION: 10 mg/mL | INTRAVENOUS | Qty: 100

## 2019-11-22 MED FILL — FAMOTIDINE (PF) 20 MG/2 ML INTRAVENOUS SOLUTION: 20 mg/2 mL | INTRAVENOUS | Qty: 2

## 2019-11-22 MED FILL — OFIRMEV 1,000 MG/100 ML (10 MG/ML) INTRAVENOUS SOLUTION: 1000 mg/100 mL (10 mg/mL) | INTRAVENOUS | Qty: 100

## 2019-11-22 MED FILL — ACETAMINOPHEN 500 MG TABLET: 500 mg | ORAL | Qty: 2

## 2019-11-22 MED FILL — FENTANYL (PF) 10 MCG/ML IN 0.9 % SODIUM CHLORIDE INTRAVENOUS: 2500 mcg/250 mL (10 mcg/mL) | INTRAVENOUS | Qty: 250

## 2019-11-22 MED FILL — SOOTHE NIGHT TIME LUBRICANT 80 %-20 % EYE OINTMENT: 80-20 % | OPHTHALMIC | Qty: 3.5

## 2019-11-22 MED FILL — NOVOLOG FLEXPEN U-100 INSULIN ASPART 100 UNIT/ML (3 ML) SUBCUTANEOUS: 100 unit/mL (3 mL) | SUBCUTANEOUS | Qty: 3

## 2019-11-22 MED FILL — DEXTROSE 50 % IN WATER (D50W) INTRAVENOUS SYRINGE: INTRAVENOUS | Qty: 50

## 2019-11-22 MED FILL — LABETALOL 5 MG/ML INTRAVENOUS SOLUTION: 5 mg/mL | INTRAVENOUS | Qty: 20

## 2019-11-22 NOTE — Progress Notes (Signed)
This is an independent service.  The available consultant for this service is Ayesha Rumpf, MD.         OTOLARYNGOLOGY - HEAD & NECK SURGERY  Progress Note    ID: 64 y.o. 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 Pt is currently hemostatic in ICU    Events: No acute events overnight.     Subjective: intubated, sedated    Objective:  Vitals  BP 121/61   Pulse 68   Temp 37 C (98.6 F) (Rectal)   Resp (!) 0   Ht 172.7 cm (5' 8")   Wt (S) 84.4 kg (186 lb 1.1 oz)   SpO2 97%   BMI 28.29 kg/m     Input / Output  I/O last 2 completed shifts plus current shift:  In: 589.95 [I.V.:409.95; NG/GT:80; IV Piggyback:100]  Out: 0       Focused Physical Exam  General: Ill appearing man, intubated  Neurological:  sedated   Cardio/Pulmonary: mechanically ventilated  HEENT:      Head/Face: NCAT, no worrisome face lesions     Eyes: left pupil and iris opaque, right pupil and iris dark brown     Ears: External ears normal-appearing     Nose: External nose normal-appearing, packed bilaterally with rhinorockets     Oral cavity: 8.0 ETT in place, no active bleeding      Neck: thick wide neck, supple, flat, trachea midline, no cervical or supraclavicular lymphadenopathy      Neuro: sedated    Summary of Labs        11/22/19  1421 11/22/19  0339 11/21/19  2304   WBC 12.9* 12.0* 11.5*   HCT 23.3* 24.9* 26.1*   PLT 243 256 276           11/22/19  0339 11/21/19  2304   NA 139 140   K 3.9 4.2   CL 100* 101   CO2 28 29   CREAT 7.30* 7.16*   GLU 105 97   MG  --  2.0   PO4  --  3.4   PT  --  16.5*   INR  --  1.4*   PTT  --  33.4*       Assessment/Plan:  64 y.o. male with history of ESRD on HD MWF, 2013 evaluation for kidney transplant right nasopharyngeal tumor now extending to the skull base who presents as OSH  transfer following episode of high volume epistaxis requiring bilateral rhinorocket placement and intubation. Pt is currently hemostatic in ICU.        - Plan for OR tomorrow for removal or packing and biopsy  -review imaging from OSH with team/neurorads and discuss need for further imaging.      Dr. Caron Presume is my MD of record           --- Please page/call the Otolaryngology - H&N Surgery service at [phone] with any questions ---

## 2019-11-22 NOTE — Plan of Care (Signed)
Problem: Airway, Ineffective - Respiratory Condition - Adult  Goal: Patent airway / effective airway clearance  Outcome: Progress within 12 hours     Problem: Deep Venous Thrombosis, Risk of - Respiratory Condition - Adult  Goal: Absence of deep venous thrombosis  Outcome: Progress within 12 hours     Problem: Gas Exchange, Impaired- Respiratory Condition - Adult  Goal: Adequate oxygenation (absence of signs and symptoms of hypoxemia)  Outcome: Progress within 12 hours     Problem: Gas Exchange, Impaired- Respiratory Condition - Adult  Goal: Adequate ventilation (absence of signs and symptoms of hypercarbia)  Outcome: Progress within 12 hours

## 2019-11-22 NOTE — H&P (Signed)
CRITICAL CARE H & P     Chief Complaint / Reason for ICU Admission   - Intubation  - Airway compromise  - Clinically significant blood loss     HISTORY OF PRESENT ILLNESS   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 Patient received 1 unit pRBC, KCentra 1,000 Units, and bleeding controlled with bilateral rhinorocket nasal packing. He was transferred to [facility] for higher level of care with ENT.     Patient intubated on arrival, and information taken from chart review until further corroboration by family can be obtained.     PAST MEDICAL HISTORY  No past medical history on file.    PAST SURGICAL HISTORY  No past surgical history on file.    CURRENT MEDICATIONS  No current facility-administered medications on file prior to encounter.     No current outpatient medications on file prior to encounter.       ALLERGIES  Allergies/Contraindications  No Known Allergies    FAMILY HISTORY  N/A    SOCIAL HISTORY  Social History     Socioeconomic History    Marital status: Married     Spouse name: Not on file    Number of children: Not on file    Years of education: Not on file    Highest education level: Not on file   Occupational History    Not on file   Tobacco Use    Smoking status: Not on file   Substance and Sexual Activity    Alcohol use: Not on file    Drug use: Not on file    Sexual activity: Not on file   Other Topics Concern    Not on file   Social History Narrative    Not on file     Social Determinants of Health     Financial Resource Strain:     Difficulty of Paying Living Expenses:    Food Insecurity:     Worried About [place] in the Last Year:     Ran Out of Food in the Last Year:    Transportation Needs:     Lexicographer (Medical):     Lack of Transportation (Non-Medical):    Physical Activity:     Days of Exercise per Week:     Minutes of Exercise per Session:    Stress:     Feeling of Stress :    Social Connections:     Frequency of Communication with Friends and Family:     Frequency of Social Gatherings with Friends and Family:     Attends Religious Services:     Active Member of Clubs or Organizations:     Attends Music therapist:     Marital Status:    Intimate Partner Violence:     Fear of Current or Ex-Partner:     Emotionally Abused:     Physically Abused:     Sexually Abused:        PHYSICAL EXAM/DATA  BP (!) 155/65 (BP Location: Left calf, Patient Position: Lying)   Pulse 75   Temp 37 C (98.6 F) (Rectal)   Resp 13   Ht 172.7 cm (_0 )   Wt (S)  84.4 kg (186 lb 1.1 oz)   SpO2 100%   BMI 28.29 kg/m     Data by System     NEUROLOGIC  Exam: Intubated, sedated  CPOT: Total  Min: 0  Max: 0  Pain meds:   - Fentanyl gtt @ 100 mcg/hr  RASS: Unarousable, No response to voice or physical stimulation  Sedation meds:   - Propofol gtt   CAM-ICU:      CARDIOVASCULAR  Pulse  Min: 75  Max: 83  Most recent: 75  BP  Min: 122/68  Max: 232/59  Most recent: (!) 155/65; MAP (mmHg)  Min: 66 mmHg  Max: 108 mmHg  Most recent: 91 mmHg  Vasoactives: None  Exam: RRR, no MRG          11/22/19  0539 11/21/19  2347   LACTWB 0.9 1.0       Temp:  [37 C (98.6 F)-37.7 C (99.9 F)] 37 C (98.6 F)  Heart Rate:  [75-83] 75  BP: (122-232)/(25-75) 155/65  *Resp:  [12-16] 13  FiO2 (%):  [30 %] 30 %  SpO2:  [97 %-100 %] 100 %    RESPIRATORY:  Resp  Min: 12  Max: 16  Most recent: 13  SpO2  Min: 97 %  Max: 100 %  Most recent: 100 %  Exam: Coarse breath sounds throughout  Oxygen/ventilatory therapy:  Mode: CMV (06/08 0347)  RR Set: 13 (06/08 0347)  Flow Rate Set (lpm): 50 (06/08 0347)  Tidal Volume Set: 400 mL (06/08 0347)  PEEP/CPAP Set (cm H2O): 7 cm H2O (06/08 0347)  FiO2 (%): 30 % (06/08 0700)  Recent Labs      11/22/19  0539 11/21/19  2347   PH37 7.47* 7.51*   PCO2 45 42   PO2 39* 38*   HCO3 33* 34*   SAO2 73* 73*   FIO2 30.00 30.00     CXR:   No results found.    GASTROINTESTINAL  Exam: Soft, nontender, nondistended  Diet: NPO Except Meds w Sips of Water Effective Now     Recent Labs     11/22/19  0339 11/21/19  2304   TBILI 0.7 0.7   AST 11 14   ALT 9* 8*   ALKP 46 47   ALB 2.1*  --        RENAL  Recent Labs     11/22/19  0339 11/21/19  2304   NA 139 140   K 3.9 4.2   CL 100* 101   CO2 28 29   BUN 38* 36*   CREAT 7.30* 7.16*   GLU 105 97   CA 9.6 9.7   MG  --  2.0   PO4  --  3.4     Fluids (last 24 hours):    Intake/Output Summary (Last 24 hours) at 11/22/2019 0810  Last data filed at 11/22/2019 0700  Gross per 24 hour   Intake 231.05 ml   Output    Net 231.05 ml         HEMATOLOGY  Recent Labs     11/22/19  0339 11/21/19  2304   HGB 7.9* 8.2*   HCT 24.9* 26.1*   PLT 256 276   INR  --  1.4*   PTT  --  33.4*     Blood products (last 24 hours): 1u pRBC (OSH)  Anticoagulation: None    INFECTIOUS DISEASE/IMMUNOLOGIC  Temp  Min: 37 C (98.6 F)  Max: 37.7 C (99.9  F)  Most recent: 25 C (98.6 F)  Recent Labs     11/22/19  0339 11/21/19  2304   WBC 12.0* 11.5*     Microbiology results:  Microbiology Results (last 72 hours)     Procedure Component Value Units Date/Time    MRSA Culture [[id number]]     Order Status: Sent Specimen: Anterior Nares Swab         Antimicrobials: None    ENDOCRINE  Blood Glucose (mg/dL)- manual entry  Min: 78  Max: 102  Most recent: 102  Recent Labs     11/22/19  0339 11/21/19  2304   GLU 105 97     Insulin orders: Insulin SS  Dextrose 25% 6.25 g x 1 overnight     Imaging within last 24 hours:  No results found.    ASSESSMENT/PLAN:  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 Now stable with tamponade of epistaxis and intubated.     NEURO:  # Pain  - Tylenol IV  - Fentanyl gtt at 100    # Sedation  - Propofol gtt    # Nasal tumor  OSH Imaging suggest tumor extension into right masticator space and hypopharynx with vascular involvement  - Bilateral rhinorockets for bleeding control  - Additional imaging will likely be needed if OSH CTA Head and Neck with Contrast unable to be uploaded.   - ENT will discuss removing nasal packing with possible bedside or OR biopsy of mass in the morning. May consider Neuro IR consult depending on finding of CTA.       CV:   # Hx HTN, HLD, CVA  - Holding home amlodipine, carvedilol, hydralazine, clonidine until stable from critical illness  - Holding eliquis in setting of bleeding    # HTN  - Labetalol 10 mg q1h PRN for SBP between 120 - 160     RESP:   # Intubated due to airway protection   - Continue mechanical ventilation  - SBT, wean as appropriate  - VBG PRN    GI:   - OGT clamp  - NPO  - Pepcid QD    RENAL:   # ESRD on HD  iHD on Tu/Th/Saturday. Electrolytes are normal on admission.  - Right AV fistula   - Consult nephrology in AM regarding HD   - Need to coordinate CTA dye load around HD schedule      HEME:   # Acute bleeding 2/2 tumor invasion into vasculature  - Maintain tamponade with rhinorockets  - Trend CBC  - T/S     ID:   # NAI    ENDO:   # DMT2  From record, home regimen appears to be Lantus 24U at bedtime  - q4h glucose checks  - ISS     ICU BUNDLE   Code Status: FULL      Patient Lines/Drains/Airways Status    Active LDAs     Name:   Placement date:   Placement time:   Site:   Days:    Peripheral IV 11/21/19 Anterior;Right External Jugular   11/21/19        External Jugular   1    Peripheral IV Anterior;Left External Jugular  External Jugular       ETT  Single Cuffed 8.0 mm   11/21/19         1    Arteriovenous Access AV Fistula Right;Upper Arm                 Arteriovenous Access AV Fistula Left;Lower Arm                GI Drain 11/21/19 Orogastric Center mouth   11/21/19         1                PT/OT: No  DVT prophylaxis: Hold in setting of bleeding  Stress Ulcer prophylaxis: Pepcid  Diet: NPO Except Meds w Sips of Water Effective Now     Bowel regimen: No  Dispo: ICU until medically stable       I discussed the care of this patient with the ICU attending and fellow.     Please contact the Critical Care Service with questions:  13 ICU: ph [phone], pgr [phone]    Elias Else, MD  11/22/2019  Critical Care Medicine Service

## 2019-11-22 NOTE — Interdisciplinary (Signed)
11/22/19 1832   Vitals   BP (!) 74/57     1832: ICU called in the room for hypotension during IHD. Levophed ordred and started. See MAR. Propofol paused. Will continue to monitor.

## 2019-11-22 NOTE — Consults (Signed)
NEPHROLOGY ESRD INITIAL CONSULT NOTE     Reason for Consultation  How should this patient with ESRD who is now being hospitalized for epistaxis be dialyzed?  Requested by attending MD [place] of the cirtical care service.    History of Present Illness / Non-Dialysis Related Issues  61M w/ ESKD on HD TTS via AVF, diabetes, prior stroke, w/ right nasopharyngeal tumor who presents after high volume epistaxis with hemoglobin drop and requiring intubation for airway protection.    Patient was intubated and sedated at the time of my evaluation.    ESRD History  On dialysis since at least 2012 (per chart review)    Regular Dialysis Schedule and Prescription  TTS via AVF  2K, Qb 400, 130mn  DW 85kg, w/ heparin infusion  EPO 5K 3x/week, Hecterol 4.53m 3x/week, Parsabiv 78m70mx wk    Chronic Dialysis Unit  [phone]  Outpatient Nephrologist: Dr. WeiAlger Simons [facility]  ESKD  Diabetes  Nasopharyngeal tumor  Prior stroke    Allergies: Patient has no known allergies.    Current IP Medications  Scheduled Meds:   sodium chloride for dialysis circuit  20-250 mL Intravenous Once    0.9% sodium chloride flush  3 mL Intravenous Q8H [facility] acetaminophen  1,000 mg Feeding Tube Q8H [facility] famotidine  10 mg Intravenous Every Other Day    insulin aspart  0-40 Units Subcutaneous Q4H SCH (Insulin)    norEPINEPHrine in sodium chloride 0.9 %        white petrolatum-mineral oil  1 Application Both Eyes 4x Daily [facility]  Continuous Infusions:   dextrose      fentaNYL 100 mcg/hr (11/22/19 1700)    norEPINEPHrine      propofoL 30.0158 mcg/kg/min (11/22/19 1700)     PRN Meds:   sodium chloride bolus  250 mL Intravenous Q2 Min PRN    sodium chloride for dialysis circuit  100 mL Intravenous Q30 Min PRN    0.9% sodium chloride flush  3 mL Intravenous PRN    dextrose  0-150 mL/hr Intravenous Continuous PRN    dextrose 50%  6.25 g Intravenous Q15 Min PRN    glucose  20 g Oral Q15 Min PRN     labetalol  10 mg Intravenous Q2H PRN    lidocaine   Topical Once PRN    lidocaine (PF)  0.2 mL Intradermal PRN    lidocaine (PF)  5 mL Subcutaneous PRN       Social History unobtainable due to: intubated.    [place]      Family History unobtainable due to: intubated    Review of Systems  Review of systems unobtainable due to intubated    Vitals  Temp:  [37.7 C (99.9 F)] 37.7 C (99.9 F)  Heart Rate:  [75-83] 75  *Resp:  [12-16] 13  BP: (122-232)/(25-75) 155/65  FiO2 (%):  [30 %] 30 %  SpO2:  [97 %-100 %] 100 %    Most Recent Weight: (S) 84.4 kg (186 lb 1.1 oz)  Admission Weight: (S) 84.4 kg (186 lb 1.1 oz)      Intake/Output Summary (Last 24 hours) at 11/22/2019 0757  Last data filed at 11/22/2019 0700  Gross per 24 hour   Intake 231.05 ml   Output    Net 231.05 ml       Physical Exam  General: Intubated and sedated  Head: Normocephalic, atraumatic  Eyes: Normal conjunctivae  ENMT: Rhino rockets in place  Lungs: Lungs clear to auscultation bilaterally  Cards: Regular rate & rhythm; normal s1/s2  Abdomen: Soft abdomen, non-tender, non-distended  MSK: No edema, clubbing or cyanosis  Derm: No obvious rash or jaundice  Neuro: Sedated  Psych:  Sedated  Hematologic: No excessive bruising note    Data  All labs available in last 3 days in [facility] reviewed.          11/22/19  0539 11/22/19  0339 11/21/19  2347 11/21/19  2304   WBC  --  12.0*  --  11.5*   HCT  --  24.9*  --  26.1*   PLT  --  256  --  276   NA  --  139  --  140   K  --  3.9  --  4.2   CL  --  100*  --  101   CO2  --  28  --  29   BUN  --  38*  --  36*   CREAT  --  7.30*  --  7.16*   GLU  --  105  --  97   CA  --  9.6  --  9.7   MG  --   --   --  2.0   PO4  --   --   --  3.4   PH37 7.47*  --  7.51*  --    PCO2 45  --  42  --    PO2 39*  --  38*  --        I reviewed the CXR showing blunting of the left costophrenic angle, clear lungs.      Assessment and Recommendations  William Sherman w/ ESKD on HD TTS via AVF, diabetes, prior stroke, w/ right  nasopharyngeal tumor who presents after high volume epistaxis with hemoglobin drop and requiring intubation for airway protection.    Non-dialysis related recommendations  #HTN  Well controlled  -Okay to continue to hold home antihypertensives for now while on sedation    #Volume  Not grossly volume overloaded  -Will remove fluid as tolerated with HD    #Hyperkalemia  Potassium well controlled with dialysis  -Low potassium diet when taking PO  -Plan HD TTS while admitted    #Anemia  Hemoglobin 7.  ON 15K units of EPO/week as an outpatient which ~ 1mg darbepoetin.  -I ordered 1063m darbepoetin    #Mineral Bone Disease  Calcium and phos normal.  On hecterol 4.34m27mqHD~zemplar 66m46m -Will give activated vitamin D on dialysis  - Will follow phos levels    Medication dosing for ESRD consideration  Dose for iHD  -No changes to current regimen    Dialysis ordered for today: Yes    Evaluation was not (and could not have been) performed during the dialysis session today as patient has to be evaluated first regarding stability and suitability for dialysis.    Dialysis Prescription and Access:   Treatment Type HD Only    HD Duration (Hrs) 3    UF goal for HD (kg) 1.5-2.5    K+ 3 mEq/L    Ca++ 2.5 mEq/L    Na+ 138 mEq/L    Bicarb 35 mEq/L    Dialysate Flow 2 x Autoflow    Dialysate Temperature (C) 37    Na+ Model Program None    UF Profile for HD None    Access Type  AVF    BFR-As tolerated to a maximum of: 400 mL/min    Dialyzer F160 (83 mL)    Machine Lines Adult (108 mL)    Crit-line RN Discretion    Goal BP type SBP    Goal SBP> 100          Dialysis schedule should be: TTS    Barnie Alderman, MD  [facility] [phone])  11/22/19    -----------------------------------------------------------------------------------------  Please see below for the appropriate Nephrology consult pager to reach [place]:  Location & Service Pager (all hours)   [facility] - all new and established consults [place] -   all new and established consults [phone]   [place] -  all new consults [phone]   [facility] Service - established consults [phone]   [facility] - established consults [phone]   [place] and established [phone]

## 2019-11-22 NOTE — Plan of Care (Signed)
Pre-HD K+ 3.8, hemodialysis x 3 hours on a 3 K dialysate bath.

## 2019-11-22 NOTE — Nursing Note (Signed)
[place] care from Loa Socks, RN.      Medication Administration:  Aranesp: No  Zemplar: No  Blood Transfusion: No   After HD MAR reviewed and After HD MAR: no medications due after HD    Post Treatment Nursing Note  Post tx vitals: Wt- unable to weigh, BP 116/57, Pulse 72.   Pt tolerated 1.5 hr treatment with net UF 400 ml fluid removed. Post treatment AVF bleeding time=15 minutes. Bruit (+) & Thrill (+). No lidocaine.  Treatment orders were adjusted during the run.   The following  are the reactions/complications which occured during this treatment and how they were managed. BP dropped drastically and to as low as 67/45, UF turned off, rinseback with NS and given extra 500 ml NS for bp support, ICU team came to the bedside, [facility], NP, ordered to terminate the the tx. ICU RN started Levophed. Notified Dr. Leitha Bleak about the situation via pager box. BP improved to 116/57 and vital signs stable post HD. Ulceration on the arterial site of the AVF covered with triple antibacterial ointment and sterile gauze. Post treatment report given to: Saum, RN.

## 2019-11-22 NOTE — Nursing Note (Signed)
Pre Treatment Nursing Note  Pre treatment report received from [place], [place]  Pre treatment vitals: Wt 84.4 kg via Bed scale, BP 122/64, P 64.  3 hours HD started in Pt Room via R-AVF QB 400  ml/min, ordered UF 2.5 L as tolerated. NS prime.    Handoff treatment report given to: Manson Allan, RN

## 2019-11-22 NOTE — Procedures (Signed)
PROCEDURE NOTE  [place]    Patient Name: William Sherman  [id number]   08/05/1955                          Diagnoses:  1. Epistaxis            Central Line    Date/Time: 11/22/2019 2:40 PM  Performed by: Apolinar Junes, MD  Authorized by: Phylis Bougie, MD     Consent Process:     Emergent situation preventing consent process:  No    Discussion with patient included the following (comment on exceptions):  Diagnosis (the reason for which the procedure is being proposed) and proposed treatment or procedure, Risks, benefits, side effects, likelihood of success of the proposed treatment, anticipated recuperation, and the alternative options and Patient's questions related to the procedure were addressed  Universal Protocol - Time Out Checklist:     Patient ID verified:  Yes    Surgical/procedural site and side verified:  Yes    Site marking verified:  YesIndications: New indication for central line (specify)  Anesthesia: local infiltration    Anesthesia:  Local Anesthetic: lidocaine 1% without epinephrine  Anesthetic total: 3 mL    Sedation:  Patient sedated: yes (Patient intubated and sedated on propofol 30 mcg/min and fentanyl 100 mcg/min)  Vitals: Vital signs were monitored during sedation.    Preparation: Chloraprep  Antiseptic: antiseptic used during central venous catheter insertion  Skin prep agent dried: skin prep agent completely dried prior to procedure  Hand hygiene: hand hygiene performed prior to central venous catheter insertion  Sterile barriers: all five maximum sterile barriers used , cap, mask, sterile gown, sterile gloves and sterile drape  Location: left internal jugular  Site selection rationale: Right IJ appears small and narrows per ultrasound exam  Patient position: Trendelenburg  Catheter type: Non-tunneled (other than dialysis)  Number of Lumens: 3  Catheter size: 9 Fr  Power line: yes  Number of attempts: 1  Successful placement: yes  Estimated blood loss: <5 mL   Post-procedure: line sutured,  dressing applied and chlorhexidine patch applied  Assessment: free fluid flow,  placement verified by x-ray and no pneumothorax on x-ray  Instrument Verification: Wire and dilator count verified, all wires and dilators used present and intact after completion of the procedurePatient tolerance: patient tolerated the procedure well with no immediate complications        Apolinar Junes  11/22/2019

## 2019-11-23 ENCOUNTER — Inpatient Hospital Stay: Admit: 2019-11-23 | Discharge: 2019-11-23 | Payer: MEDICARE

## 2019-11-23 DIAGNOSIS — D72829 Elevated white blood cell count, unspecified: Secondary | ICD-10-CM

## 2019-11-23 DIAGNOSIS — I720 Aneurysm of carotid artery: Secondary | ICD-10-CM

## 2019-11-23 LAB — BASIC METABOLIC PANEL (NA, K,
Anion Gap: 10 (ref 4–14)
Calcium, total, Serum / Plasma: 9.2 mg/dL (ref 8.4–10.5)
Carbon Dioxide, Total: 28 mmol/L (ref 22–29)
Chloride, Serum / Plasma: 101 mmol/L (ref 101–110)
Creatinine: 6.29 mg/dL — ABNORMAL HIGH (ref 0.73–1.24)
Glucose, non-fasting: 94 mg/dL (ref 70–199)
Potassium, Serum / Plasma: 4.6 mmol/L (ref 3.5–5.0)
Sodium, Serum / Plasma: 139 mmol/L (ref 135–145)
Urea Nitrogen, Serum / Plasma: 35 mg/dL — ABNORMAL HIGH (ref 7–25)
eGFR - high estimate: 10 mL/min — ABNORMAL LOW (ref >59)
eGFR - low estimate: 9 mL/min — ABNORMAL LOW (ref >59)

## 2019-11-23 LAB — VENOUS BLOOD GAS W/LACTATE
Base excess: 7 mmol/L
Bicarbonate: 32 mmol/L — ABNORMAL HIGH (ref 23–29)
Calcium, Ionized, whole blood: 1.27 mmol/L (ref 1.13–1.27)
Chloride, whole blood: 101 mmol/L (ref 99–108)
FIO2: 35 % (ref 20–100)
Glucose, whole blood: 96 mg/dL (ref 70–199)
Hematocrit from Hb: 25 % — ABNORMAL LOW (ref 41–53)
Hemoglobin, Whole Blood: 8.2 g/dL — ABNORMAL LOW (ref 13.6–17.5)
Lactate, whole blood: 1.1 mmol/L (ref 0.5–2.0)
Oxygen Saturation: 97 % (ref 95–100)
PCO2: 54 mm Hg — ABNORMAL HIGH (ref 32–46)
PO2: 88 mm Hg (ref 83–108)
Potassium, Whole Blood: 3.7 mmol/L (ref 3.4–4.5)
Sodium, whole blood: 139 mmol/L (ref 136–146)
pH, Blood: 7.38 (ref 7.35–7.45)

## 2019-11-23 LAB — ARTERIAL BLOOD GAS ONLY
Base excess: 6 mmol/L
Bicarbonate: 30 mmol/L — ABNORMAL HIGH (ref 23–29)
FIO2: 30 % (ref 20–100)
Oxygen Saturation: 97 % (ref 95–100)
PCO2: 43 mm Hg (ref 32–46)
PO2: 81 mm Hg — ABNORMAL LOW (ref 83–108)
pH, Blood: 7.45 (ref 7.35–7.45)

## 2019-11-23 LAB — COMPLETE BLOOD COUNT
Abs Basophils: 0.06 10*9/L (ref 0.00–0.10)
Abs Eosinophils: 0.2 10*9/L (ref 0.00–0.40)
Abs Imm Granulocytes: 0.15 10*9/L — ABNORMAL HIGH (ref <0.10)
Abs Lymphocytes: 1.28 10*9/L (ref 1.00–3.40)
Abs Monocytes: 1.15 10*9/L — ABNORMAL HIGH (ref 0.20–0.80)
Abs Neutrophils: 18.53 10*9/L — ABNORMAL HIGH (ref 1.80–6.80)
Hematocrit: 22.2 % — ABNORMAL LOW (ref 41.0–53.0)
Hematocrit: 23.6 % — ABNORMAL LOW (ref 41.0–53.0)
Hematocrit: 24 % — ABNORMAL LOW (ref 41.0–53.0)
Hematocrit: 24.6 % — ABNORMAL LOW (ref 41.0–53.0)
Hematocrit: 25.5 % — ABNORMAL LOW (ref 41.0–53.0)
Hematocrit: 26.3 % — ABNORMAL LOW (ref 41.0–53.0)
Hemoglobin: 7 g/dL — CL (ref 13.6–17.5)
Hemoglobin: 7.4 g/dL — ABNORMAL LOW (ref 13.6–17.5)
Hemoglobin: 7.4 g/dL — ABNORMAL LOW (ref 13.6–17.5)
Hemoglobin: 7.7 g/dL — ABNORMAL LOW (ref 13.6–17.5)
Hemoglobin: 8.1 g/dL — ABNORMAL LOW (ref 13.6–17.5)
Hemoglobin: 8.4 g/dL — ABNORMAL LOW (ref 13.6–17.5)
MCH: 28.4 pg (ref 26.0–34.0)
MCH: 28.6 pg (ref 26.0–34.0)
MCH: 28.6 pg (ref 26.0–34.0)
MCH: 28.9 pg (ref 26.0–34.0)
MCH: 29 pg (ref 26.0–34.0)
MCH: 29 pg (ref 26.0–34.0)
MCHC: 30.8 g/dL — ABNORMAL LOW (ref 31.0–36.0)
MCHC: 31.3 g/dL (ref 31.0–36.0)
MCHC: 31.4 g/dL (ref 31.0–36.0)
MCHC: 31.5 g/dL (ref 31.0–36.0)
MCHC: 31.8 g/dL (ref 31.0–36.0)
MCHC: 31.9 g/dL (ref 31.0–36.0)
MCV: 91 fL (ref 80–100)
MCV: 91 fL (ref 80–100)
MCV: 91 fL (ref 80–100)
MCV: 91 fL (ref 80–100)
MCV: 92 fL (ref 80–100)
MCV: 92 fL (ref 80–100)
Platelet Count: 230 10*9/L (ref 140–450)
Platelet Count: 233 10*9/L (ref 140–450)
Platelet Count: 235 10*9/L (ref 140–450)
Platelet Count: 236 10*9/L (ref 140–450)
Platelet Count: 247 10*9/L (ref 140–450)
Platelet Count: 283 10*9/L (ref 140–450)
RBC Count: 2.42 10*12/L — ABNORMAL LOW (ref 4.40–5.90)
RBC Count: 2.59 10*12/L — ABNORMAL LOW (ref 4.40–5.90)
RBC Count: 2.61 10*12/L — ABNORMAL LOW (ref 4.40–5.90)
RBC Count: 2.69 10*12/L — ABNORMAL LOW (ref 4.40–5.90)
RBC Count: 2.79 10*12/L — ABNORMAL LOW (ref 4.40–5.90)
RBC Count: 2.9 10*12/L — ABNORMAL LOW (ref 4.40–5.90)
WBC Count: 12.6 10*9/L — ABNORMAL HIGH (ref 3.4–10.0)
WBC Count: 16 10*9/L — ABNORMAL HIGH (ref 3.4–10.0)
WBC Count: 16.1 10*9/L — ABNORMAL HIGH (ref 3.4–10.0)
WBC Count: 16.8 10*9/L — ABNORMAL HIGH (ref 3.4–10.0)
WBC Count: 17.1 10*9/L — ABNORMAL HIGH (ref 3.4–10.0)
WBC Count: 21.7 10*9/L — ABNORMAL HIGH (ref 3.4–10.0)

## 2019-11-23 LAB — PROTHROMBIN TIME
Int'l Normaliz Ratio: 1.5 — ABNORMAL HIGH (ref 0.9–1.2)
PT: 17.4 s — ABNORMAL HIGH (ref 11.7–14.9)

## 2019-11-23 LAB — PHOSPHORUS, SERUM / PLASMA: Phosphorus, Serum / Plasma: 4.2 mg/dL (ref 2.3–4.7)

## 2019-11-23 LAB — PREPARE RBC
RBCs - Units Ready: 2
RBCs - Units Ready: 1

## 2019-11-23 LAB — COMPREHENSIVE METABOLIC PANEL
AST: 12 U/L (ref 5–44)
Alanine transaminase: 10 U/L (ref 10–61)
Albumin, Serum / Plasma: 2.1 g/dL — ABNORMAL LOW (ref 3.4–4.8)
Alkaline Phosphatase: 50 U/L (ref 38–108)
Anion Gap: 9 (ref 4–14)
Bilirubin, Total: 0.8 mg/dL (ref 0.2–1.2)
Calcium, total, Serum / Plasma: 8.9 mg/dL (ref 8.4–10.5)
Carbon Dioxide, Total: 29 mmol/L (ref 22–29)
Chloride, Serum / Plasma: 101 mmol/L (ref 101–110)
Creatinine: 4.87 mg/dL — ABNORMAL HIGH (ref 0.73–1.24)
Glucose, non-fasting: 101 mg/dL (ref 70–199)
Potassium, Serum / Plasma: 3.8 mmol/L (ref 3.5–5.0)
Protein, Total, Serum / Plasma: 6 g/dL — ABNORMAL LOW (ref 6.3–8.6)
Sodium, Serum / Plasma: 139 mmol/L (ref 135–145)
Urea Nitrogen, Serum / Plasma: 27 mg/dL — ABNORMAL HIGH (ref 7–25)
eGFR - high estimate: 14 mL/min — ABNORMAL LOW (ref >59)
eGFR - low estimate: 12 mL/min — ABNORMAL LOW (ref >59)

## 2019-11-23 LAB — MAGNESIUM, SERUM / PLASMA: Magnesium, Serum / Plasma: 2 mg/dL (ref 1.6–2.6)

## 2019-11-23 LAB — HEPATITIS B SURFACE ANTIGEN: Hep B surf Ag: NEGATIVE

## 2019-11-23 LAB — POCT GLUCOSE
Glucose, iSTAT: 102 mg/dL (ref 70–199)
Glucose, iSTAT: 85 mg/dL (ref 70–199)
Glucose, iSTAT: 94 mg/dL (ref 70–199)
Glucose, iSTAT: 88 mg/dL (ref 70–199)
Glucose, iSTAT: 98 mg/dL (ref 70–199)
Glucose, iSTAT: 62 mg/dL — ABNORMAL LOW (ref 70–199)

## 2019-11-23 LAB — ACTIVATED PARTIAL THROMBOPLAST: Activated Partial Thromboplast: 36 s — ABNORMAL HIGH (ref 20.9–30.9)

## 2019-11-23 MED ORDER — IOHEXOL 350 MG IODINE/ML INTRAVENOUS SOLUTION
350 | Freq: Once | INTRAVENOUS | Status: AC
Start: 2019-11-23 — End: 2019-11-23
  Administered 2019-11-23: 21:00:00 70 mL via INTRAVENOUS

## 2019-11-23 MED ORDER — SODIUM CHLORIDE 0.9 % INTRAVENOUS PIGGYBACK
0.9 | Freq: Three times a day (TID) | INTRAVENOUS | Status: DC
Start: 2019-11-23 — End: 2019-11-24
  Administered 2019-11-24 (×3): 2.25 g via INTRAVENOUS

## 2019-11-23 MED ORDER — FAMOTIDINE (PF) 20 MG/2 ML INTRAVENOUS SOLUTION
20 mg/2 mL | INTRAVENOUS | Status: DC
  Administered 2019-11-23: 16:00:00 20 mg via INTRAVENOUS

## 2019-11-23 MED ORDER — NOREPINEPHRINE BITARTRATE 4 MG/250 ML (16 MCG/ML) IN 0.9 % NACL IV
4 | INTRAVENOUS | Status: DC
Start: 2019-11-23 — End: 2019-11-29
  Administered 2019-11-26: 15:00:00 0.02 ug/kg/min via INTRAVENOUS

## 2019-11-23 MED ORDER — HEPARIN, PORCINE (PF) 100 UNIT/ML INTRAVENOUS SYRINGE
100 unit/mL | INTRAVENOUS | Status: DC
  Administered 2019-11-24 – 2019-12-05 (×5): 300 [IU] via INTRAVENOUS

## 2019-11-23 MED ORDER — FENTANYL (PF) 50 MCG/ML INJECTION SOLUTION
50 | INTRAMUSCULAR | Status: DC | PRN
Start: 2019-11-23 — End: 2019-11-26
  Administered 2019-11-23 – 2019-11-24 (×4): 50 ug via INTRAVENOUS

## 2019-11-23 MED ORDER — SODIUM CHLORIDE 0.9 % INTRAVENOUS SOLUTION
0.9 % | INTRAVENOUS | Status: AC
  Administered 2019-11-24: 01:00:00 1500 mg via INTRAVENOUS

## 2019-11-23 MED ORDER — GADOTERATE MEGLUMINE 0.5 MMOL/ML (376.9 MG/ML) INTRAVENOUS SOLUTION
0.5 | Freq: Once | INTRAVENOUS | Status: AC
Start: 2019-11-23 — End: 2019-11-23
  Administered 2019-11-23: 15:00:00 17 mL via INTRAVENOUS

## 2019-11-23 MED ORDER — HEPARIN, PORCINE (PF) 100 UNIT/ML INTRAVENOUS SYRINGE: 100 unit/mL | INTRAVENOUS | Status: DC | PRN

## 2019-11-23 MED ORDER — LANSOPRAZOLE 30 MG DELAYED RELEASE,DISINTEGRATING TABLET
30 mg | ORAL | Status: DC
  Administered 2019-11-24 – 2019-12-01 (×9): 30 mg via GASTROSTOMY

## 2019-11-23 MED ORDER — VANCOMYCIN 500 MG INTRAVENOUS SOLUTION
500 | INTRAVENOUS | Status: DC
Start: 2019-11-23 — End: 2019-11-27
  Administered 2019-11-24 – 2019-11-26 (×2): 500 mg via INTRAVENOUS

## 2019-11-23 MED ORDER — CEPHALEXIN 250 MG CAPSULE
250 | Freq: Three times a day (TID) | ORAL | Status: DC
Start: 2019-11-23 — End: 2019-11-23
  Administered 2019-11-23: 21:00:00 250 mg via ORAL

## 2019-11-23 MED ORDER — ATORVASTATIN 40 MG TABLET
40 mg | ORAL | Status: DC
  Administered 2019-11-24 – 2019-12-01 (×8): 40 mg via GASTROSTOMY

## 2019-11-23 MED FILL — ARANESP 100 MCG/ML (IN POLYSORBATE) INJECTION: 100 mcg/mL | INTRAMUSCULAR | Qty: 1

## 2019-11-23 MED FILL — NOREPINEPHRINE BITARTRATE 4 MG/250 ML (16 MCG/ML) IN 0.9 % NACL IV: 4 mg/250 mL (16 mcg/mL) | INTRAVENOUS | Qty: 250

## 2019-11-23 MED FILL — ACETAMINOPHEN 500 MG TABLET: 500 mg | ORAL | Qty: 2

## 2019-11-23 MED FILL — FENTANYL (PF) 10 MCG/ML IN 0.9 % SODIUM CHLORIDE INTRAVENOUS: 2500 mcg/250 mL (10 mcg/mL) | INTRAVENOUS | Qty: 250

## 2019-11-23 MED FILL — DIPRIVAN 10 MG/ML INTRAVENOUS EMULSION: 10 mg/mL | INTRAVENOUS | Qty: 100

## 2019-11-23 MED FILL — FENTANYL (PF) 50 MCG/ML INJECTION SOLUTION: 50 mcg/mL | INTRAMUSCULAR | Qty: 2

## 2019-11-23 MED FILL — DEXTROSE 50 % IN WATER (D50W) INTRAVENOUS SYRINGE: INTRAVENOUS | Qty: 50

## 2019-11-23 MED FILL — CEPHALEXIN 250 MG CAPSULE: 250 mg | ORAL | Qty: 1

## 2019-11-23 MED FILL — FAMOTIDINE (PF) 20 MG/2 ML INTRAVENOUS SOLUTION: 20 mg/2 mL | INTRAVENOUS | Qty: 2

## 2019-11-23 MED FILL — NEPHRO-VITE 0.8 MG TABLET: 0.8 mg | ORAL | Qty: 1

## 2019-11-23 MED FILL — ATORVASTATIN 40 MG TABLET: 40 mg | ORAL | Qty: 1

## 2019-11-23 NOTE — Procedures (Signed)
PROCEDURE NOTE  [place]    Patient Name: William Sherman  [id number]   1956/05/14                          Diagnoses:  1. Epistaxis        Arterial Line    Date/Time: 11/23/2019 1:56 AM  Performed by: Drue Second, NP  Authorized by: Drue Second, NP     Consent Process:     Emergent situation preventing consent process:  No (telephone consent with family caregiver)    Discussion with patient included the following (comment on exceptions):  Diagnosis (the reason for which the procedure is being proposed) and proposed treatment or procedure, Risks, benefits, side effects, likelihood of success of the proposed treatment, anticipated recuperation, and the alternative options and Patient's questions related to the procedure were addressed  Universal Protocol - Time Out Checklist:     Patient ID verified:  Yes    Surgical/procedural site and side verified:  Yes    Site marking verified:  N/A  Indications:     Indications: hemodynamic monitoring    Pre-procedure details:     Skin preparation:  Chloraprep and 2% Chlorhexidine    Preparation: Patient was prepped and draped in sterile fashion    Sedation:     Procedural sedation:  Patient not sedated  Anesthetic agents (see MAR for exact dosages):     Anesthetic used:  Local infiltration    Local anesthetic:  Lidocaine  Procedure details:     Location:  L femoral    Needle gauge:  20 G    Placement technique:  Ultrasound guided     Sterile gel and probe covers used for ultrasound: yes    Number of attempts:  1  Post-procedure details:     Post-procedure:  Sutured    Complications:  None    Patient tolerance of procedure:  Tolerated well, no immediate complications        Ulyess Blossom [facility]  11/23/2019    This is an independent service.  The available consultant for this service is Phylis Bougie, MD.

## 2019-11-23 NOTE — Consults (Signed)
Wound Note  63yoM w/ ESKD on HD TTS via AVF, diabetes, prior stroke, w/ right nasopharyngeal tumor who presents after high volume epistaxis with hemoglobin drop and requiring intubation for airway protection.    Wound care consulted for RUE active AV fistula wound    Braden: 12    Image:      RUE      Type of Wound: Erosion of AV fistula site    Stage of wound: Full thickness    Evaluation/Assessment of wound: RUE wound- measured 1.5 x1.5cm with light yellow  Slough with a scant amount of sero/sang exudate with no odor.  Periwound skin circumferential darker brown hyperpimentation and light pink scar tissue    Recommendations:  R/L side lying only unless medically contraindicated.  Turn and reposition Q 2 hours unless medically contraindicated  HOB 30 degrees or lower unless medically contraindicated  Minimal layers of linens under patient  Float heels or Z flow boots  Patients with medical device-rotate device site and pad device  Incontinent patients use topical moisture barriers; use absorbent pads  No diapers/briefs while patient in bed  Use lift and HoverMatt to turn, lift or reposition pt.  Use waffle cushion if pt sitting up in chair  Maximize OT/PT  Educate patient and family regarding pressure injury prevention  Initiate "Pressure Ulcer at Risk or Actual" Nursing Care Plan  [facility] bed ordered    RUE wound care- Cleanse with Anasept, air dry and the cover with Mepilex Border Ag (silver impregnated) 4x4 ([id number])  Change every other day.    Recommendations communicated to: Bedside RN    Plan of Care: Wound Care will now sign off on this patient, thank you for including [place] in this patient care, please re-consult for any new or deteriorating wound or continence needs. Continue with plan of care.    DSandman, RN, MSN, [facility], [place]

## 2019-11-23 NOTE — Progress Notes (Signed)
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 Patient received 1 unit pRBC, KCentra 1,000 Units, and bleeding controlled with bilateral rhinorocket nasal packing. He was transferred to [facility] for higher level of care with ENT.       Reason for ICU admission:  Intubation  Mechanical ventilation    24 Hour Events:  - iHD overnight --> became hypotensive to 70's/50's, received rinse back, 2L IVF, briefly norepi  - Femoral arterial line placed  - Additional imaging obtained with MR Face -- reads pending     Subjective:  Intubated, sedated    Medications:   Scheduled Meds:   0.9% sodium chloride flush  3 mL Intravenous Q8H [place]    acetaminophen  1,000 mg Feeding Tube Q8H [place]    B-complex with vitamin C-Folic Acid  1 tablet Per OG Tube Daily [place]    famotidine  20 mg Intravenous Daily [place]    insulin aspart  0-40 Units Subcutaneous Q4H [place] (Insulin)    white petrolatum-mineral oil  1 Application Both Eyes 4x Daily [place]     Continuous Infusions:   dextrose      fentaNYL 100 mcg/hr (11/23/19 0600)    norEPINEPHrine      propofoL 9.9921 mcg/kg/min (11/23/19 0600)     PRN Meds:    0.9% sodium chloride flush  3 mL Intravenous PRN    dextrose  0-150 mL/hr Intravenous Continuous PRN    dextrose 50%  6.25 g Intravenous Q15 Min PRN    fentaNYL citrate  25-50 mcg Intravenous Q1H PRN    glucose  20 g Oral Q15 Min PRN    labetalol  10 mg Intravenous Q2H PRN    lidocaine (PF)  5 mL Subcutaneous PRN       NEUROLOGIC  Exam: sedated  CPOT: Total  Min: 0  Max: 0  Pain meds:   - APAP 1,000 q8h [place]  - Fentanyl gtt @ 100  RASS: Moderate sedation, Any movement (but no eye contact) to voice  Sedation meds:   - Propofol gtt @ 10    CAM-ICU: Unable to Assess; RASS -4 or -5    CARDIOVASCULAR  Pulse  Min: 61  Max: 75  Most recent: 75  BP  Min: 53/29  Max: 233/36  Most recent: (!) 143/37; MAP (mmHg)  Min: 16 mmHg  Max: 91 mmHg  Most recent: 72 mmHg  Vasoactives: None  Exam: RRR          11/22/19  1855 11/22/19  1648 11/22/19  1127 11/22/19  0859 11/22/19  0539   LACTWB 1.1 0.6 0.7 0.8 0.9       Temp:  [36.2 C (97.2 F)] 36.2 C (97.2 F)  Heart Rate:  [61-75] 75  BP: (53-233)/(13-77) 143/37  Arterial Line BP (mmHg) : (116-135)/(40-45) 118/43  *Resp:  [0-13] 10  FiO2 (%):  [30 %-100 %] 100 %  SpO2:  [95 %-100 %] 100 %  O2 Device: Other (Comment)    RESPIRATORY:  Resp  Min: 0  Max: 13  Most recent: (!) 10  SpO2  Min: 95 %  Max: 100 %  Most recent: 100 %  Exam: Coarse breath sounds throughout  Oxygen/ventilatory  therapy:  Mode: CMV (06/09 0817)  RR Set: 10 (06/09 0817)  Flow Rate Set (lpm): 48 (06/09 0817)  Tidal Volume Set: 450 mL (06/09 0817)  PEEP/CPAP Set (cm H2O): 5 cm H2O (06/09 0817)  FiO2 (%): 30 % (06/09 0900)  Recent Labs     11/23/19  0414 11/22/19  1855 11/22/19  1648 11/22/19  1127 11/22/19  0859   PH37 7.45 7.38 7.40 7.40 7.47*   PCO2 43 54* 52* 51* 46   PO2 81* 88 59* 38* 32*   HCO3 30* 32* 32* 31* 33*   SAO2 97 97 89* 66* 59*   FIO2 30.00 35.00 Not specified 30.00 50.00     CXR:    XR Chest 1 View (AP Portable)    Result Date: 11/22/2019  FINDINGS/IMPRESSION: Endotracheal tube tip is 2 cm above the carina. Enteric tube projects over the midline and terminates below the diaphragm. Left internal jugular catheter tip terminates in the midline brachiocephalic vein, likely less than 1 cm from the superior vena cava lumen. Recommend advancement. Blunting of the left costophrenic angle which may represent effusion, atelectasis or infection. Remainder the lungs is clear. Cardiac and mediastinal contours unchanged without acute findings. Report dictated by: Luberta Mutter, MD, signed by: Luberta Mutter, MD Department of Radiology  and Biomedical Imaging     XR KUB, Flat Plate, Abdomen 1 View    Result Date: 11/22/2019  Lines/drains/medical devices: Nasogastric tube with the tip in the gastric body and the sidehole in the gastric fundus Bowel gas pattern is unremarkable, nonobstructive. Degenerative disc disease in the lumbar spine Report dictated by: Clare Charon, MD, signed by: Clare Charon, MD Department of Radiology and Biomedical Imaging      GASTROINTESTINAL  Exam: Soft non distended  Diet: NPO Strict (No PO Meds) Effective Now     Recent Labs     11/22/19  1856 11/22/19  0339 11/21/19  2304   TBILI 0.8 0.7 0.7   AST _0 ALT 10 9* 8*   ALKP 50 46 47   ALB 2.1* 2.1*  --        RENAL  Recent Labs     11/23/19  0413 11/22/19  1856 11/22/19  0339 11/21/19  2304   NA 139 139 139 140   K 4.6 3.8 3.9 4.2   CL 101 101 100* 101   CO2 _1 BUN 35* 27* 38* 36*   CREAT 6.29* 4.87* 7.30* 7.16*   GLU 94 101 105 97   CA 9.2 8.9 9.6 9.7   MG 2.0  --   --  2.0   PO4 4.2  --   --  3.4     Fluids (last 24 hours):    Intake/Output Summary (Last 24 hours) at 11/23/2019 0732  Last data filed at 11/23/2019 0600  Gross per 24 hour   Intake 1752.92 ml   Output 1300 ml   Net 452.92 ml         HEMATOLOGY  Recent Labs     11/23/19  1032 11/23/19  0413 11/22/19  2201 11/22/19  1856 11/22/19  1740 11/21/19  2304   HGB 7.0* 7.4* 7.4* 8.1* 7.7* 8.2*   HCT 22.2* 23.6* 24.0* 25.5* 24.6* 26.1*   PLT 235 230 233 283 236 276   INR  --  1.5*  --   --   --  1.4*   PTT  --  36.0*  --   --   --  33.4*     Blood products (last 24 hours): None  Anticoagulation: None    INFECTIOUS DISEASE/IMMUNOLOGIC  Temp  Min: 36.2 C (97.2 F)  Max: 36.2 C (97.2 F)  Most recent: 36.2 C (97.2 F)  Recent Labs     11/23/19  1032 11/23/19  0413 11/22/19  2201 11/22/19  1856 11/22/19  1740   WBC 16.0* 17.1* 16.1* 21.7* 12.6*   NEUTA  --   --   --  18.53*  --      Microbiology results:  Microbiology Results (last 72 hours)     Procedure Component Value Units Date/Time    MRSA Culture  [[id number]]     Order Status: Sent Specimen: Anterior Nares Swab         Antimicrobials: None    ENDOCRINE  Blood Glucose (mg/dL)- manual entry  Min: 85  Max: 102  Most recent: 94  Recent Labs     11/23/19  0413 11/22/19  1856 11/22/19  0339 11/21/19  2304   GLU 94 101 105 97     Insulin orders: ISS    Imaging within last 24 hours:   XR Chest 1 View (AP Portable)    Result Date: 11/22/2019  FINDINGS/IMPRESSION: Endotracheal tube tip is 2 cm above the carina. Enteric tube projects over the midline and terminates below the diaphragm. Left internal jugular catheter tip terminates in the midline brachiocephalic vein, likely less than 1 cm from the superior vena cava lumen. Recommend advancement. Blunting of the left costophrenic angle which may represent effusion, atelectasis or infection. Remainder the lungs is clear. Cardiac and mediastinal contours unchanged without acute findings. Report dictated by: Luberta Mutter, MD, signed by: Luberta Mutter, MD Department of Radiology and Biomedical Imaging     XR KUB, Flat Plate, Abdomen 1 View    Result Date: 11/22/2019  Lines/drains/medical devices: Nasogastric tube with the tip in the gastric body and the sidehole in the gastric fundus Bowel gas pattern is unremarkable, nonobstructive. 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 Now stable with tamponade of epistaxis and intubated.     NEURO:  # Pain  - Tylenol PO   - Fentanyl gtt    # Sedation  - Propofol gtt    # Nasal tumor  OSH Imaging suggest tumor extension into right masticator space and hypopharynx with vascular involvement  - Bilateral rhinorockets for bleeding control  - Follow up [facility] Head Imaging and ENT regarding surgical plan     CV:   # Hx HTN, HLD, CVA  - Restart home atorvastatin   - Holding home amlodipine, carvedilol, hydralazine, clonidine until  stable from critical illness  - Holding eliquis in setting of bleeding    # HTN  - Labetalol 10 mg q1h PRN for SBP between 120 - 160     RESP:   # Intubated due to airway protection   - Continue mechanical ventilation  - SBT, wean as appropriate  - ABG PRN    GI:   - OGT   - Nutrition consult  - Hold TF in setting of possible OR interventions today   - Pepcid QD    RENAL:   # ESRD on HD  iHD on Tu/Th/Saturday. Electrolytes are normal on admission.  - Right AV fistula   - Nephrology following for iHD       HEME:   # Acute bleeding 2/2 tumor invasion into vasculature. Hgb downtrending to 7.0 this morning.  - Tranfuse 1u pRBC  - Maintain tamponade with rhinorockets   - Trend CBC BID  - Maintain T/S     ID:   # Downtrending leukocytosis  - CTM   - Monitor AVF site ulceration (treated with triple antibacterial ointment after HD treatment)   - Start Keflex for gram positive coverage while nasal packing in place     ENDO:   # DMT2  From record, home regimen appears to be Lantus 24U at bedtime  - q4h glucose checks  - ISS     ICU BUNDLE  Code status: Code Status: FULL      Patient Lines/Drains/Airways Status    Active LDAs     Name:   Placement date:   Placement time:   Site:   Days:    PSI Introducer 11/22/19 Left Internal jugular   11/22/19    1430       less than 1    Peripheral IV 11/21/19 Anterior;Right External Jugular   11/21/19        External Jugular   2     ETT  Single Cuffed 8.0 mm   11/21/19         2    Arterial Line 11/23/19 Femoral   11/23/19    0054     less than 1    Arteriovenous Access AV Fistula Right;Upper Arm                Arteriovenous Access AV Fistula Left;Lower Arm                Wound 11/22/19 Other (Comment) Arm Distal;Right;Upper;Anterior   11/22/19    1731    Arm   less than 1    GI Drain 11/21/19 Orogastric Center mouth   11/21/19         2                PT/OT: No  DVT prophylaxis: None  Stress Ulcer prophylaxis: Pepcid  Diet: NPO     Bowel regimen: None  Dispo: ICU     I discussed the care of this patient with ICU attending and fellow.     Please contact the Critical Care Service with questions:  13 ICU: ph [phone], pgr [phone]    Elias Else, MD  11/23/19  Critical Care Medicine Service

## 2019-11-23 NOTE — Progress Notes (Addendum)
OTOLARYNGOLOGY - HEAD & NECK SURGERY  Progress Note    ID: 64 y.o. 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 Pt is currently hemostatic in ICU    Hospital Course:   6/7: Transferred from OSH intubated and hemostatic.  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    Subjective: intubated, sedated    Objective:  Vitals  BP (!) 143/37   Pulse 64   Temp 36.3 C (97.3 F) (Axillary)   Resp (!) 10   Ht 172.7 cm (5' 8")   Wt 84.4 kg (186 lb 1.1 oz)   SpO2 97%   BMI 28.29 kg/m     Input / Output  I/O last 2 completed shifts plus current shift:  In: 2229.91 [I.V.:649.91; NG/GT:80; IV Piggyback:300]  Out: 1300       Focused Physical Exam  General: Ill appearing man, intubated  Neurological:  sedated   Cardio/Pulmonary: mechanically ventilated  HEENT:      Head/Face: NCAT, no worrisome face lesions     Eyes: left pupil and iris opaque, right pupil and iris dark brown     Ears: External ears normal-appearing     Nose: External nose normal-appearing, packed bilaterally with rhinorockets     Oral cavity: 8.0 ETT in place, no active bleeding      Neck: thick wide neck, supple, flat, trachea midline, no cervical or supraclavicular lymphadenopathy      Neuro: sedated    Summary of Labs        11/23/19  1032 11/23/19  0413 11/22/19  2201   WBC 16.0* 17.1* 16.1*   HCT 22.2* 23.6* 24.0*   PLT 235 230 233           11/23/19  0413 11/22/19  1856 11/22/19  0339 11/21/19  2304   NA 139 139 139 140   K 4.6 3.8 3.9 4.2   CL 101 101 100* 101   CO2 _0 CREAT 6.29* 4.87* 7.30* 7.16*   GLU 94 101 105 97   MG 2.0  --   --  2.0   PO4 4.2  --   --  3.4   PT 17.4*  --   --  16.5*   INR 1.5*  --   --  1.4*   PTT 36.0*  --   --  33.4*      Imaging:  MRI Face, Naso, Neck with and without contrast 11/23/19  IMPRESSION:   1.  Large multi-spatial centrally necrotic mass centered on the right nasopharynx with intracranial extension into the right middle and posterior cranial fossa. Superior extent of this tumor appears to the level of the sphenoid sinuses and cavernous sinus; inferior most extent appears to be the parapharyngeal space at the level of the supraglottis. Contralateral extension of this tumor extends to involve the left nasopharynx towards the left carotid space.  2.  Several areas of extra-axial, intracranial extension including at the skull base and middle cranial fossa. Areas of perineural extension along the right foramen rotundum, ovale, and multiple skull base foramina.  3.  Both sphenoid sinuses and posterior ethmoid air cells are involved; remaining paranasal sinuses appear uninvolved.  4.  Likely short segment occlusion versus severe stenosis of the distal  cervical right internal jugular vein and at the jugular bulb. Recommend attention on follow-up CTA.  5.  Invasion of the right ICA wall at the level of the petrous/cavernous junction with circumferential tumor thickening and enhancement and at least moderate narrowing. Attention on CTA to this area is also recommended.  6.  Multistation cervical lymph node involvement the largest including both retropharyngeal lymph nodes.    CTA Head and Neck for stroke protocol 11/23/19  IMPRESSION:   1.  Active extravasation from the right cervical internal carotid artery and to right nasopharyngeal mass with large retropharyngeal hematoma that extends from the skull base to the C3 level, likely secondary to invasion of the right ICA due to the nasopharyngeal mass. There is also right ICA invasion of the junction of the petrous and cavernous segment, as well as opacification of the cavernous sinus and arterial phase suspicious for carotid evaluation of the region of the cavernous sinus.  2.   Extensive nasopharyngeal mass with intracranial extension which is better described on same-day MRI.    This patient was       Assessment/Plan:  64 y.o. 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     - appreciate excellent global care per primary team  - ID consult for empiric antibiotics  - Neuro IR consult for embolization  - would start [place] conversations with family in the setting of very possible life-threatening bleeding  - [facility] will defer any surgical operation for biopsy to guide antibiotic treatment until CA aneurysm is addressed    Juanetta Snow MD  Resident Physician, PGY-2  Otolaryngology - Head & Neck Surgery  11/23/19    --- Please page/call the Otolaryngology - H&N Surgery Consult service with any questions ---

## 2019-11-23 NOTE — Plan of Care (Signed)
Problem: Airway, Ineffective - Respiratory Condition - Adult  Goal: Patent airway / effective airway clearance  11/23/2019 2229 by Allie Bossier, RN  Outcome: Progress within 12 hours  11/23/2019 2226 by Allie Bossier, RN  Outcome: Progress within 12 hours     Problem: Deep Venous Thrombosis, Risk of - Respiratory Condition - Adult  Goal: Absence of deep venous thrombosis  11/23/2019 2229 by Allie Bossier, RN  Outcome: Progress within 12 hours  11/23/2019 2226 by Allie Bossier, RN  Outcome: Progress within 12 hours     Problem: Gas Exchange, Impaired- Respiratory Condition - Adult  Goal: Adequate oxygenation (absence of signs and symptoms of hypoxemia)  11/23/2019 2229 by Allie Bossier, RN  Outcome: Progress within 12 hours  11/23/2019 2226 by Allie Bossier, RN  Outcome: Progress within 12 hours  Goal: Adequate ventilation (absence of signs and symptoms of hypercarbia)  11/23/2019 2229 by Allie Bossier, RN  Outcome: Progress within 12 hours  11/23/2019 2226 by Allie Bossier, RN  Outcome: Progress within 12 hours     Problem: Pressure Ulcer,  At Risk and Actual- Adult  Goal: Absence of any new pressure ulcers  11/23/2019 2229 by Allie Bossier, RN  Outcome: Progress within 12 hours  11/23/2019 2226 by Allie Bossier, RN  Outcome: Progress within 12 hours     Problem: Electrolyte Imbalance - Apheresis Hemodialysis Patient - Adult  Goal: Dialyzed with appropriate bath based on lab values  11/23/2019 2229 by Allie Bossier, RN  Outcome: Progress within 12 hours  11/23/2019 2226 by Allie Bossier, RN  Outcome: Progress within 12 hours     Problem: Activity Intolerance - Adult  Goal: Able to perform physical activity as ordered  Outcome: Progress within 12 hours     Problem: Anxiety, Patient / Family / Caregiver - Adult  Goal: Alleviation of anxiety (Patient / Family / Caregiver)  Outcome: Progress within 12 hours  Goal: Expression of calm and reduction in anxiety symptoms  Outcome: Progress within 12 hours     Problem: Bleeding, at Risk or Actual - Adult   Goal: Absence of impaired coagulation signs and symptoms / active bleeding  Outcome: Progress within 12 hours     Problem: Body Temperature Imbalanced, at Risk or Actual - Adult  Goal: Body temperature within specified parameters  Outcome: Progress within 12 hours     Problem: Coping Ineffective, Patient / Family / Caregiver - Adult  Goal: Effective coping (Patient / Family / Caregiver)  Outcome: Progress within 12 hours     Problem: Deep Venous Thrombosis, Risk of - Adult  Goal: Absence of deep venous thrombosis  11/23/2019 2229 by Allie Bossier, RN  Outcome: Progress within 12 hours  11/23/2019 2226 by Allie Bossier, RN  Outcome: Progress within 12 hours     Problem: Fluid Volume, Imbalanced - Adult  Goal: Absence of fluid imbalance signs and symptoms  Outcome: Progress within 12 hours     Problem: Gas Exchange, Impaired - Adult  Goal: Adequate oxygenation (absence of signs and symptoms of hypoxemia)  11/23/2019 2229 by Allie Bossier, RN  Outcome: Progress within 12 hours  11/23/2019 2226 by Allie Bossier, RN  Outcome: Progress within 12 hours  Goal: Adequate ventilation (absence of signs and symptoms of hypercarbia)  11/23/2019 2229 by Allie Bossier, RN  Outcome: Progress within 12 hours  11/23/2019 2226 by Allie Bossier, RN  Outcome: Progress within 12 hours     Problem: GI Elimination Impaired - Adult  Goal: Passage of soft, formed stool  Outcome: Progress within 12 hours     Problem: Glycemic Control, Abnormal - Adult  Goal: Maintain glucose levels within specified parameters  Outcome: Progress within 12 hours     Problem: GU Elimination Impaired - Adult  Goal: Able to void spontaneously  Outcome: Progress within 12 hours     Problem: Infection, at Risk and Actual - Adult  Goal: Prevention of infection  Outcome: Progress within 12 hours  Goal: Resolution of Infection  Outcome: Progress within 12 hours     Problem: Mobility, Impaired - Adult  Goal: Able to perform exercises as instructed  Outcome: Progress within 12 hours      Problem: Nausea/Vomiting - Adult  Goal: Absence of nausea and vomiting  Outcome: Progress within 12 hours     Problem: Nutrition, Alteration in - Adult  Goal: Maximize nutritional intake per patient condition  Outcome: Progress within 12 hours     Problem: Pain,  Acute / Chronic - Adult  Goal: Control of acute pain  Outcome: Progress within 12 hours  Goal: Control of chronic pain  Outcome: Progress within 12 hours  Goal: Involvement in pain management plan  Outcome: Progress within 12 hours     Problem: Self-care Deficit - Medical Condition - Adult  Goal: Completes grooming / oral care / bathing, with assistance as needed  Outcome: Progress within 12 hours     Problem: Skin Integrity, Impaired - Adult  Goal: Skin/wound healing  Outcome: Progress within 12 hours     Problem: Sleep Pattern Disturbance - Adult  Goal: Optimal balance of rest and activity demonstrated or reported  Outcome: Progress within 12 hours

## 2019-11-23 NOTE — Consults (Signed)
NEPHROLOGY FOLLOW UP CONSULT NOTE     Events  Underwent 1.5h of dialysis complicated by severe intradialytic hypotension.    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      Subjective  Intubated and sedated    Review of Systems    Review of systems unobtainable due to intubated    Vitals  Temp:  [36.1 C (97 F)-36.3 C (97.3 F)] 36.3 C (97.3 F)  Heart Rate:  [62-75] 70  *Resp:  [9-18] 15  BP: (118-170)/(26-48) 143/37  Arterial Line BP (mmHg) : (116-198)/(40-71) 198/71  FiO2 (%):  [30 %-100 %] 30 %  SpO2:  [93 %-100 %] 97 %    Most Recent Weight: 84.4 kg (186 lb 1.1 oz)  Admission Weight: (S) 84.4 kg (186 lb 1.1 oz)      Intake/Output Summary (Last 24 hours) at 11/23/2019 2219  Last data filed at 11/23/2019 2114  Gross per 24 hour   Intake 1574.53 ml   Output    Net 1574.53 ml       Current IP Medications  Scheduled Meds:   0.9% sodium chloride flush  3 mL Intravenous Q8H [place]    acetaminophen  1,000 mg Feeding Tube Q8H [place]    atorvastatin  40 mg Feeding Tube Q PM [place]    B-complex with vitamin C-Folic Acid  1 tablet Per OG Tube Daily [place]    heparin flush  300 Units Intravenous Daily [place]    insulin aspart  0-40 Units Subcutaneous Q4H [facility] (Insulin)    lansoprazole  30 mg Feeding Tube Q AM Before Breakfast [place]    piperacillin-tazobactam  2.25 g Intravenous Q8H [place]    vancomycin  500 mg Intravenous After Hemodialysis    white petrolatum-mineral oil  1 Application Both Eyes 4x Daily [place]     Continuous Infusions:   dextrose      fentaNYL 100 mcg/hr (11/23/19 2100)    norEPINEPHrine      propofoL 4.9961 mcg/kg/min (11/23/19 2100)     PRN Meds:   0.9% sodium chloride flush  3 mL Intravenous PRN    dextrose  0-150 mL/hr Intravenous Continuous PRN    dextrose 50%  6.25 g Intravenous Q15 Min PRN    fentaNYL citrate  25-50 mcg Intravenous Q1H PRN    glucose  20 g  Oral Q15 Min PRN    heparin flush  300 Units Intravenous PRN    labetalol  10 mg Intravenous Q2H PRN    lidocaine (PF)  5 mL Subcutaneous PRN       Physical Exam  General: Intubated and sedated  Head: Normocephalic, atraumatic  Eyes: Normal conjunctivae  ENMT: Rhino rockets in place  Lungs: Lungs clear to auscultation bilaterally  Cards: Regular rate & rhythm; normal s1/s2  Abdomen: Soft abdomen, non-tender, non-distended  MSK: No edema, clubbing or cyanosis  Derm: No obvious rash or jaundice  Neuro: Sedated  Psych:  Sedated  Hematologic: No excessive bruising note      Data  All labs available in last 3 days in [facility] reviewed.          11/23/19  2025 11/23/19  1602 11/23/19  1032 11/23/19  0414 11/23/19  0413 11/22/19  1856 11/22/19  1855 11/22/19  0539 11/22/19  0339 11/21/19  2304   WBC 15.9* 16.8* 16.0*  --  17.1* 21.7*  --    < >  12.0* 11.5*   HCT 26.1* 26.3* 22.2*  --  23.6* 25.5*  --    < > 24.9* 26.1*   PLT 236 247 235  --  230 283  --    < > 256 276   NA  --   --   --   --  139 139  --   --  139 140   K  --   --   --   --  4.6 3.8  --   --  3.9 4.2   CL  --   --   --   --  101 101  --   --  100* 101   CO2  --   --   --   --  28 29  --   --  28 29   BUN  --   --   --   --  35* 27*  --   --  38* 36*   CREAT  --   --   --   --  6.29* 4.87*  --   --  7.30* 7.16*   GLU  --   --   --   --  94 101  --   --  105 97   CA  --   --   --   --  9.2 8.9  --   --  9.6 9.7   MG  --   --   --   --  2.0  --   --   --   --  2.0   PO4  --   --   --   --  4.2  --   --   --   --  3.4   PH37 7.42  --   --  7.45  --   --  7.38  --   --   --    PCO2 44  --   --  43  --   --  54*  --   --   --    PO2 90  --   --  81*  --   --  88  --   --   --     < > = values in this interval not displayed.         Reviewed CTA head/neck and MR face showing invasive nasopharyngeal mass with intracranial extension.      Assessment and Recommendations  32M w/ ESKD on HD TTS via AVF, diabetes, prior stroke, w/ right nasopharyngeal tumor who  presents after high volume epistaxis with hemoglobin drop and requiring intubation for airway protection.    Non-dialysis related recommendations  #ESKD  -Plan HD TTS while admitted    #HTN  Well controlled off anti-HTN  -Continue to hold home anti-htn    #Volume  Euvolemic on exam  -Will aim to keep net even    #Hyperkalemia  Potassium well controlled with dialysis  -Low potassium diet if taking PO/tube feeds    #Anemia  Hemoglobin 7.  ON 15K units of EPO/week as an outpatient which ~ 352mg darbepoetin.  -S/p 1060m darbepoetin 6/8    #Mineral Bone Disease  Calcium and phos normal.  On hecterol 4.52m73mqHD~zemplar 60m78m -Will give activated vitamin D on dialysis  - Will follow phos levels    #Medication dosing for kidney failure on iHD  -zosyn 2.25g q8h appropriate  -vancomycing 500mg69mt HD appropriate      GabriWilburn Cornelia  Leitha Bleak, MD  [facility] [phone])  11/23/19    -----------------------------------------------------------------------------------------  Please see below for the appropriate Nephrology consult pager to reach [place]:  Location & Service Pager (all hours)   [facility] - all new and established consults [place] -  all new and established consults [phone]   [place] -  all new consults [phone]   [facility] - established consults [phone]   [place] [facility] - established consults [phone]   [place] and established [phone]

## 2019-11-23 NOTE — Consults (Signed)
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 Patient received 1 unit pRBC, KCentra 1,000 Units, and bleeding controlled with bilateral rhinorocket nasal packing. He was transferred to [facility] for higher level of care with ENT.       Diet Order: NPO    Advanced Therapies:  Mechanical Ventilation   HD    Anthropometric Measurements:  Ht: 172.7 cm (_0 )  Admit Weight (6/7): 84.4 kg (121% IBW)  Current Weight (6/9): 84.4 kg   IBW: 70 kg +/- 10%  UBW: unable to obtain  BMI: 28.3 kg/m2 (based on 84.4 kg wt) c/w overweight for height status    Weight history: Unable to obtain history    Food/Nutrition Related history: Unable to obtain  Known food allergies & intolerances: none known    Nutrition-Focused Physical Findings:  Overall Appearance/Comments:  WDWN  Cognition: (6/9) RASS -4, CAM-ICU UTA   Functional Status: Per PT note, Activity Achieved: Passive ROM (11/23/19 0900)  AM-PAC Mobility 6 Clicks Score: 6 (79/48/01 0900)  Fluid status: Edema: Right upper extremity;Left upper extremity;Right lower extremity;Left lower extremity (11/22/19 1700)     Medical tests/procedures:  KUB (6/8): Nasogastric tube with the tip in the gastric body and the sidehole in the gastric fundus  Bowel gas pattern is unremarkable, nonobstructive.    GI history:   Mucous Membrane(s): Pink and moist (06/09)  Abdomen Inspection: Soft (06/09)  Bowel Sounds (All Quadrants): Hypoactive (06/09)  Tenderness: Soft (06/09)  Passing Flatus: Yes (06/09)  Stool: (6/8) none; (6/9)  x1 BM today  UO Output: anuric    Microbiology: reviewed    Integumentary:  Skin Condition: Intact, no pressure injury documented    Biochemical profile:   Renal/Lytes:  Lab Results   Component Value Date    NA 139 11/23/2019    K 4.6 11/23/2019    CL 101 11/23/2019    CO2 28 11/23/2019    BUN 35 (H) 11/23/2019    CREAT 6.29 (H) 11/23/2019    MG 2.0 11/23/2019    PO4 4.2 11/23/2019    GFRAA 10 (L) 11/23/2019   Comments: ESRD managed with iHD      Endocrine/Exocrine:  Lab Results   Component Value Date    GLUPC 98 11/23/2019    GLUPC 88 11/23/2019    GLUPC 94 11/23/2019    GLUPC 85 11/22/2019    GLUPC 102 11/22/2019    GLUPC 100 11/22/2019    GLUPC 94 11/22/2019    GLUPC 102 11/22/2019    GLUPC 110 11/22/2019    GLUPC 78 11/22/2019     Lab Results   Component Value Date    GLU 94 11/23/2019   Comments:  controlled blood sugars    Liver/GI:   Lab Results   Component Value Date    AST 12 11/22/2019    ALT 10 11/22/2019    ALKP 50 11/22/2019    TBILI 0.8 11/22/2019    PT 17.4 (H) 11/23/2019  INR 1.5 (H) 11/23/2019     No results found for: NH3  No results found for: LIPA  No results found for: AMY  Comments: stable    CV profile:   MAPs (6/9) 64-111, Lactate (6/8): 1.1  Comment:  HD stable - no vasoactive infusions    Lipid Profile:   Triglycerides, serum   Date Value Ref Range Status   11/22/2019 117 <200 mg/dL Final     Vitamin/Mineral Profile:  No results found    Nutritional Anemia Profile:   Lab Results   Component Value Date    WBC Count 16.0 (H) 11/23/2019    Hemoglobin 7.0 (LL) 11/23/2019    Hematocrit 22.2 (L) 11/23/2019    MCV 92 11/23/2019    Platelet Count 235 11/23/2019   Comments: Normocytic anemia noted     Inflammatory Response Profile:    WBC Count   Date Value Ref Range Status   11/23/2019 16.0 (H) 3.4 - 10.0 x10E9/L Final   11/23/2019 17.1 (H) 3.4 - 10.0 x10E9/L Final     No results found for: CRP  No results found for: PAB    Nutritionally Relevant Medications:    acetaminophen  1,000 mg Feeding  Tube Q8H [place]    atorvastatin  40 mg Feeding Tube Q PM [place]    B-complex with vitamin C-Folic Acid  1 tablet Per OG Tube Daily [place]    famotidine  20 mg Intravenous Daily [place]    insulin aspart  0-40 Units Subcutaneous Q4H [place] (Insulin)     Continuous medication:   fentaNYL 100 mcg/hr (11/23/19 1200)    propofoL 15 mcg/kg/min (11/23/19 1430)     Comparative Standards: Estimated Needs (based on 84.4 kg wt):  Energy: 2250  - 2450 kcal/day (REE 1619 x 1.4 - 1.5)  _0  [place]. Jeor Equation  Protein: 100 - 120 g/day (1.2 - 1.4 g/kg)  _1  using 84.4 kg  Fluid: Per primary team    Nutrition Diagnosis: Inadequate oral intake related to mechanical ventilation dependency as evidenced by NPO status and without nutrition support.    Nutrition Intervention:  Enteral Nutrition:     When ready to feed, recd the following regimen based upon the patient's age, condition, utrition requirements, enteral route, and GI status.  --> Begin *Novasource Renal @ 15 mL/hr.  --> Advance 15 mL/hr q 8 hrs to goal of 50 mL/hr.  Goal TF provides 1200 mL, 2400 kcal, 109 g protein, 9.2 g CHO/hr, meets 100% RDI for vitamins/minerals.    TF Monitoring:  -->Monitor gastric residuals, abdominal exam, lactate trends, hemodynamic changes  -->Follow serum BUN, Creat, lytes, BG, ion Ca, Mg, Phos; adjust TF regimen prn  -->Cont to monitor I/O's   -->Check TF residuals q 8 hours, and hold if > 500 mL    Inflammatory Response:  --> will monitor weekly CRP and prealbumin (track trend).to help assess adequacy of nutrition support regimen.    Vitamin and Mineral Supplements:   --> Check serum 25(OH)D w/ AM labs 6/10 only to evaluate for deficiency .  --> Cont nephrovite 1 tablet, daily via FT     Nutrition-Related Medication Management:  --> Recommended avoid all sorbitol containing elixir meds (ie: tylenol solution, KCl solution) to prevent GI distress (diarrhea, gas, bloating)    --> Blood glucose management per team. Aim to keep blood glucose between  100-160 mg/dL  --> Management of nutrition-impact symptoms per team    Additional monitoring requests for medical team  --> Monitor serum TG  every other day while on propofol per ICU protocol; may need to adjust nutrition support regimen, once at goal if propofol still required.    Monitoring & Evaluation / Goals:  See Nutrition Care Plan for goals / progress.    Erin Hearing, RD, CNSC  [place]: [phone]    For Evenings, Weekends or [place] Page [phone]

## 2019-11-24 DIAGNOSIS — R930 Abnormal findings on diagnostic imaging of skull and head, not elsewhere classified: Secondary | ICD-10-CM

## 2019-11-24 DIAGNOSIS — I720 Aneurysm of carotid artery: Secondary | ICD-10-CM

## 2019-11-24 DIAGNOSIS — I671 Cerebral aneurysm, nonruptured: Secondary | ICD-10-CM

## 2019-11-24 DIAGNOSIS — E119 Type 2 diabetes mellitus without complications: Secondary | ICD-10-CM

## 2019-11-24 LAB — ARTERIAL BLOOD GAS ONLY
Base excess: 4 mmol/L
Base excess: 4 mmol/L
Bicarbonate: 28 mmol/L (ref 23–29)
Bicarbonate: 29 mmol/L (ref 23–29)
FIO2: 30 % (ref 20–100)
FIO2: 30 % (ref 20–100)
Oxygen Saturation: 98 % (ref 95–100)
Oxygen Saturation: 97 % (ref 95–100)
PCO2: 44 mm Hg (ref 32–46)
PCO2: 46 mm Hg (ref 32–46)
PO2: 90 mm Hg (ref 83–108)
PO2: 86 mm Hg (ref 83–108)
pH, Blood: 7.42 (ref 7.35–7.45)
pH, Blood: 7.4 (ref 7.35–7.45)

## 2019-11-24 LAB — TYPE AND SCREEN
ABO/RH(D): O POS
Antibody Screen: NEGATIVE
Blood Product Expiration Date: 202106142359
Blood Product Expiration Date: 202107102359
Blood Product Expiration Date: 202107102359
Blood Product Issue Date/Time: 202106091307
UDIV: 0
UDIV: 0
UDIV: 0
Unit Type and Rh: 5100
Unit Type and Rh: 5100
Unit Type and Rh: 5100
Unit Type and Rh: POSITIVE
Unit Type and Rh: POSITIVE
Unit Type and Rh: POSITIVE

## 2019-11-24 LAB — PREALBUMIN: Prealbumin: 7 mg/dL — ABNORMAL LOW (ref 20–37)

## 2019-11-24 LAB — COMPLETE BLOOD COUNT
Hematocrit: 26.1 % — ABNORMAL LOW (ref 41.0–53.0)
Hematocrit: 25.9 % — ABNORMAL LOW (ref 41.0–53.0)
Hematocrit: 27.8 % — ABNORMAL LOW (ref 41.0–53.0)
Hematocrit: 26.1 % — ABNORMAL LOW (ref 41.0–53.0)
Hemoglobin: 8.4 g/dL — ABNORMAL LOW (ref 13.6–17.5)
Hemoglobin: 8.1 g/dL — ABNORMAL LOW (ref 13.6–17.5)
Hemoglobin: 8.9 g/dL — ABNORMAL LOW (ref 13.6–17.5)
Hemoglobin: 8.4 g/dL — ABNORMAL LOW (ref 13.6–17.5)
MCH: 28.8 pg (ref 26.0–34.0)
MCH: 28.4 pg (ref 26.0–34.0)
MCH: 28.9 pg (ref 26.0–34.0)
MCH: 29 pg (ref 26.0–34.0)
MCHC: 32.2 g/dL (ref 31.0–36.0)
MCHC: 31.3 g/dL (ref 31.0–36.0)
MCHC: 32 g/dL (ref 31.0–36.0)
MCHC: 32.2 g/dL (ref 31.0–36.0)
MCV: 89 fL (ref 80–100)
MCV: 91 fL (ref 80–100)
MCV: 90 fL (ref 80–100)
MCV: 90 fL (ref 80–100)
Platelet Count: 236 10*9/L (ref 140–450)
Platelet Count: 239 10*9/L (ref 140–450)
Platelet Count: 262 10*9/L (ref 140–450)
Platelet Count: 267 10*9/L (ref 140–450)
RBC Count: 2.92 10*12/L — ABNORMAL LOW (ref 4.40–5.90)
RBC Count: 2.85 10*12/L — ABNORMAL LOW (ref 4.40–5.90)
RBC Count: 3.08 10*12/L — ABNORMAL LOW (ref 4.40–5.90)
RBC Count: 2.9 10*12/L — ABNORMAL LOW (ref 4.40–5.90)
WBC Count: 15.9 10*9/L — ABNORMAL HIGH (ref 3.4–10.0)
WBC Count: 15 10*9/L — ABNORMAL HIGH (ref 3.4–10.0)
WBC Count: 15.3 10*9/L — ABNORMAL HIGH (ref 3.4–10.0)
WBC Count: 14.8 10*9/L — ABNORMAL HIGH (ref 3.4–10.0)

## 2019-11-24 LAB — POCT GLUCOSE
Glucose, iSTAT: 104 mg/dL (ref 70–199)
Glucose, iSTAT: 97 mg/dL (ref 70–199)
Glucose, iSTAT: 102 mg/dL (ref 70–199)
Glucose, iSTAT: 100 mg/dL (ref 70–199)
Glucose, iSTAT: 105 mg/dL (ref 70–199)
Glucose, iSTAT: 96 mg/dL (ref 70–199)

## 2019-11-24 LAB — C-REACTIVE PROTEIN: C-Reactive Protein: 302.5 mg/L — ABNORMAL HIGH (ref <5.1)

## 2019-11-24 LAB — BASIC METABOLIC PANEL (NA, K,
Anion Gap: 10 (ref 4–14)
Calcium, total, Serum / Plasma: 8.9 mg/dL (ref 8.4–10.5)
Carbon Dioxide, Total: 25 mmol/L (ref 22–29)
Chloride, Serum / Plasma: 101 mmol/L (ref 101–110)
Creatinine: 6.81 mg/dL — ABNORMAL HIGH (ref 0.73–1.24)
Glucose, non-fasting: 115 mg/dL (ref 70–199)
Potassium, Serum / Plasma: 4.3 mmol/L (ref 3.5–5.0)
Sodium, Serum / Plasma: 136 mmol/L (ref 135–145)
Urea Nitrogen, Serum / Plasma: 45 mg/dL — ABNORMAL HIGH (ref 7–25)
eGFR - high estimate: 9 mL/min — ABNORMAL LOW (ref >59)
eGFR - low estimate: 8 mL/min — ABNORMAL LOW (ref >59)

## 2019-11-24 LAB — PHOSPHORUS, SERUM / PLASMA: Phosphorus, Serum / Plasma: 4.8 mg/dL — ABNORMAL HIGH (ref 2.3–4.7)

## 2019-11-24 LAB — MAGNESIUM, SERUM / PLASMA: Magnesium, Serum / Plasma: 2.1 mg/dL (ref 1.6–2.6)

## 2019-11-24 LAB — VITAMIN D, 25-HYDROXY: Vitamin D, 25-Hydroxy: 16 ng/mL — ABNORMAL LOW (ref 20–50)

## 2019-11-24 LAB — TRIGLYCERIDES, SERUM: Triglycerides, serum: 94 mg/dL (ref <200)

## 2019-11-24 MED ORDER — SODIUM CHLORIDE 0.9 % DIALYSIS CIRCUIT
0.9 | Freq: Once | INTRAVENOUS | Status: AC
Start: 2019-11-24 — End: 2019-11-24
  Administered 2019-11-24: 17:00:00 200 mL via INTRAVENOUS

## 2019-11-24 MED ORDER — SODIUM CHLORIDE 0.9 % IV BOLUS
0.9 | INTRAVENOUS | Status: DC | PRN
Start: 2019-11-24 — End: 2019-11-26

## 2019-11-24 MED ORDER — LIDOCAINE (PF) 10 MG/ML (1 %) INJECTION SOLUTION
10 | INTRAMUSCULAR | Status: AC | PRN
Start: 2019-11-24 — End: 2019-11-24
  Administered 2019-11-24: 14:00:00 0.2 mL via INTRADERMAL

## 2019-11-24 MED ORDER — SODIUM CHLORIDE 0.9 % DIALYSIS CIRCUIT
0.9 | INTRAVENOUS | Status: AC | PRN
Start: 2019-11-24 — End: 2019-11-24

## 2019-11-24 MED ORDER — SODIUM CHLORIDE 0.9 % INTRAVENOUS PIGGYBACK
0.9 % | Freq: Every evening | INTRAVENOUS | Status: DC
Start: 2019-11-24 — End: 2019-11-26
  Administered 2019-11-25 – 2019-11-26 (×2): 500 mg via INTRAVENOUS

## 2019-11-24 MED ORDER — LIDOCAINE 4 % TOPICAL CREAM
4 | Freq: Once | TOPICAL | Status: DC | PRN
Start: 2019-11-24 — End: 2019-11-26

## 2019-11-24 MED ORDER — SODIUM CHLORIDE 0.9 % DIALYSIS CIRCUIT
0.9 | Freq: Once | INTRAVENOUS | Status: AC
Start: 2019-11-24 — End: 2019-11-24
  Administered 2019-11-24: 14:00:00 200 mL via INTRAVENOUS

## 2019-11-24 MED ORDER — LABETALOL 5 MG/ML INTRAVENOUS SOLUTION
5 mg/mL | INTRAVENOUS | Status: DC | PRN
  Administered 2019-11-25 – 2019-11-30 (×18): 10 mg via INTRAVENOUS

## 2019-11-24 MED FILL — HEPARIN LOCK FLUSH (PORCINE) (PF) 100 UNIT/ML INTRAVENOUS SYRINGE: 100 unit/mL | INTRAVENOUS | Qty: 3

## 2019-11-24 MED FILL — ATORVASTATIN 40 MG TABLET: 40 mg | ORAL | Qty: 1

## 2019-11-24 MED FILL — FENTANYL (PF) 10 MCG/ML IN 0.9 % SODIUM CHLORIDE INTRAVENOUS: 2500 mcg/250 mL (10 mcg/mL) | INTRAVENOUS | Qty: 250

## 2019-11-24 MED FILL — PIPERACILLIN-TAZOBACTAM 2.25 GRAM INTRAVENOUS SOLUTION: 2.25 gram | INTRAVENOUS | Qty: 2250

## 2019-11-24 MED FILL — NEPHRO-VITE 0.8 MG TABLET: 0.8 mg | ORAL | Qty: 1

## 2019-11-24 MED FILL — VANCOMYCIN 1.5 GRAM INTRAVENOUS SOLUTION: 1.5 gram | INTRAVENOUS | Qty: 30

## 2019-11-24 MED FILL — ACETAMINOPHEN 500 MG TABLET: 500 mg | ORAL | Qty: 2

## 2019-11-24 MED FILL — LANSOPRAZOLE 30 MG DELAYED RELEASE,DISINTEGRATING TABLET: 30 mg | ORAL | Qty: 1

## 2019-11-24 MED FILL — FENTANYL (PF) 50 MCG/ML INJECTION SOLUTION: 50 mcg/mL | INTRAMUSCULAR | Qty: 2

## 2019-11-24 MED FILL — DIPRIVAN 10 MG/ML INTRAVENOUS EMULSION: 10 mg/mL | INTRAVENOUS | Qty: 100

## 2019-11-24 MED FILL — XYLOCAINE-MPF 10 MG/ML (1 %) INJECTION SOLUTION: 10 mg/mL (1 %) | INTRAMUSCULAR | Qty: 2

## 2019-11-24 MED FILL — VANCOMYCIN 500 MG INTRAVENOUS SOLUTION: 500 mg | INTRAVENOUS | Qty: 10

## 2019-11-24 NOTE — Plan of Care (Signed)
Problem: Airway, Ineffective - Respiratory Condition - Adult  Goal: Patent airway / effective airway clearance  Outcome: Progress within 12 hours     Problem: Deep Venous Thrombosis, Risk of - Respiratory Condition - Adult  Goal: Absence of deep venous thrombosis  Outcome: Progress within 12 hours     Problem: Gas Exchange, Impaired- Respiratory Condition - Adult  Goal: Adequate oxygenation (absence of signs and symptoms of hypoxemia)  Outcome: Progress within 12 hours  Goal: Adequate ventilation (absence of signs and symptoms of hypercarbia)  Outcome: Progress within 12 hours     Problem: Pressure Ulcer,  At Risk and Actual- Adult  Goal: Absence of any new pressure ulcers  Outcome: Progress within 12 hours     Problem: Electrolyte Imbalance - Apheresis Hemodialysis Patient - Adult  Goal: Dialyzed with appropriate bath based on lab values  Outcome: Progress within 12 hours     Problem: Activity Intolerance - Adult  Goal: Able to perform physical activity as ordered  Outcome: Progress within 12 hours     Problem: Anxiety, Patient / Family / Caregiver - Adult  Goal: Alleviation of anxiety (Patient / Family / Caregiver)  Outcome: Progress within 12 hours  Goal: Expression of calm and reduction in anxiety symptoms  Outcome: Progress within 12 hours     Problem: Bleeding, at Risk or Actual - Adult  Goal: Absence of impaired coagulation signs and symptoms / active bleeding  Outcome: Progress within 12 hours     Problem: Body Temperature Imbalanced, at Risk or Actual - Adult  Goal: Body temperature within specified parameters  Outcome: Progress within 12 hours     Problem: Coping Ineffective, Patient / Family / Caregiver - Adult  Goal: Effective coping (Patient / Family / Caregiver)  Outcome: Progress within 12 hours     Problem: Deep Venous Thrombosis, Risk of - Adult  Goal: Absence of deep venous thrombosis  Outcome: Progress within 12 hours     Problem: Fluid Volume, Imbalanced - Adult  Goal: Absence of fluid  imbalance signs and symptoms  Outcome: Progress within 12 hours     Problem: Gas Exchange, Impaired - Adult  Goal: Adequate oxygenation (absence of signs and symptoms of hypoxemia)  Outcome: Progress within 12 hours  Goal: Adequate ventilation (absence of signs and symptoms of hypercarbia)  Outcome: Progress within 12 hours     Problem: GI Elimination Impaired - Adult  Goal: Passage of soft, formed stool  Outcome: Progress within 12 hours     Problem: Glycemic Control, Abnormal - Adult  Goal: Maintain glucose levels within specified parameters  Outcome: Progress within 12 hours     Problem: GU Elimination Impaired - Adult  Goal: Able to void spontaneously  Outcome: Progress within 12 hours     Problem: Infection, at Risk and Actual - Adult  Goal: Prevention of infection  Outcome: Progress within 12 hours  Goal: Resolution of Infection  Outcome: Progress within 12 hours     Problem: Mobility, Impaired - Adult  Goal: Able to perform exercises as instructed  Outcome: Progress within 12 hours     Problem: Nausea/Vomiting - Adult  Goal: Absence of nausea and vomiting  Outcome: Progress within 12 hours     Problem: Nutrition, Alteration in - Adult  Goal: Maximize nutritional intake per patient condition  Outcome: Progress within 12 hours     Problem: Pain,  Acute / Chronic - Adult  Goal: Control of acute pain  Outcome: Progress within 12 hours  Goal: Control  of chronic pain  Outcome: Progress within 12 hours  Goal: Involvement in pain management plan  Outcome: Progress within 12 hours     Problem: Self-care Deficit - Medical Condition - Adult  Goal: Completes grooming / oral care / bathing, with assistance as needed  Outcome: Progress within 12 hours     Problem: Skin Integrity, Impaired - Adult  Goal: Skin/wound healing  Outcome: Progress within 12 hours     Problem: Sleep Pattern Disturbance - Adult  Goal: Optimal balance of rest and activity demonstrated or reported  Outcome: Progress within 12 hours

## 2019-11-24 NOTE — Consults (Signed)
Patient:  William Sherman    [facility] NP  [facility] Rounds Attending: Dr. Karene Fry      64 y.o. male with history of epistaxis 2/2 R ICA extravasation/invasion from [facility], now admitted to ICU, intubated, and on hemodialysis.      Blood pressure (!) 167/51, pulse 69, temperature 36.4 C (97.5 F), temperature source Axillary, resp. rate (!) 10, height 172.7 cm (_0 ), weight 85.7 kg (188 lb 15 oz), SpO2 97 %.  Orally intubated, sedated  No neuro exam  Rhino-rockets in place       Pertinent labs:     Lab Results   Component Value Date/Time    CREAT 6.81 (H) 11/24/2019 04:10 AM    GFRC 8 (L) 11/24/2019 04:10 AM    GFRAA 9 (L) 11/24/2019 04:10 AM    INR 1.5 (H) 11/23/2019 04:13 AM    PLT 239 11/24/2019 04:10 AM    HCT 25.9 (L) 11/24/2019 04:10 AM    WBC 15.0 (H) 11/24/2019 04:10 AM        AP:  64 y.o. male with history of epistaxis 2/2 R ICA extravasation/invasion from [facility], now admitted to ICU, intubated, and on hemodialysis.  Consult to neuro IR for possible angiogram/embolization. Patient is complex medically, no currently extravasation.   - NIR on standby pending goals of care discussion with family.  - Following peripherally     Rodena Goldmann, NP  NIR Pager [phone]    Total service time: 15 Minutes including direct patient care, coordination of care, counseling, and interdisciplinary communication

## 2019-11-24 NOTE — Progress Notes (Signed)
CRITICAL CARE PROGRESS NOTE - ATTENDING ONLY     11/24/2019    24 Hour Course  CT angiogram and MRI completed  Neuro IR consulted    Subjective  Intubated sedated    Vitals  (min & max of the last 24-hours or since hospital admission; most recent)  HR: Pulse  Min: 61  Max: 76; Heart Rate: 74 (11/24/19 1600)  ABP: Arterial Line BP (mmHg)   Min: 110/42  Max: 198/71; 129/44 (11/24/19 1600)  AMAP: Arterial Line MAP (mmHg)  Min: 60 mmHg  Max: 118 mmHg; 71 mmHg (11/24/19 1600)  NBP: BP  Min: 167/51  Max: 167/51;  (150/49  A line) (11/24/19 1045)  NMAP: MAP (mmHg)  Min: 83 mmHg  Max: 95 mmHg; 83 mmHg (11/24/19 0715)  RR: Resp  Min: 10  Max: 20; 14 (11/24/19 1600)  SpO2: SpO2  Min: 94 %  Max: 100 %; 95 % (11/24/19 1600)  Temp: Temp  Min: 36.3 C (97.3 F)  Max: 37.3 C (99.1 F); 37.3 C (99.1 F) (11/24/19 1600)    Intake/Output  (from 0700 yesterday to 0700 today)  Net + 1.9 liters    Physical Exam, Data and Advanced Monitoring/Therapies  Neuro: intubated sedated no response to tactile stimulation  CV: RRR  Pulm: intubated. CMV peep 5 30%  GI: soft Nontender   Renal/GU: dialyzed today - 1.9 liters  Heme/Onc: hgb 8.4 Plt 267  ID/Immuno/Rheum:WBC 14.8  Endo: glu 96-105    Lines (site, type, placement date): RUE AVF  Left IJ introducer L radial a line  Activity in the last 24-hours (e.g.: ambulated, out of bed to chair, sat on edge of bed, bed-bound, etc.): bedrest    Labs        11/24/19  1543 11/24/19  1028 11/24/19  0410 11/23/19  2025 11/23/19  1602 11/23/19  0414 11/23/19  0413 11/22/19  1856 11/22/19  1855 11/22/19  1648 11/22/19  1127 11/22/19  0859 11/22/19  0859 11/22/19  0539 11/22/19  0339 11/21/19  2304   PH37  --   --  7.40 7.42  --  7.45  --   --  7.38 7.40 7.40  --  7.47* 7.47*  --   --    PCO2  --   --  46 44  --  43  --   --  54* 52* 51*  --  46 45  --   --    PO2  --   --  86 90  --  81*  --   --  88 59* 38*  --  32* 39*  --   --    HCO3  --   --  29 28  --  30*  --   --  32* 32* 31*  --  33* 33*  --   --     SAO2  --   --  97 98  --  97  --   --  97 89* 66*  --  59* 73*  --   --    LACTWB  --   --   --   --   --   --   --   --  1.1 0.6 0.7  --  0.8 0.9  --   --    NA  --   --  136  --   --   --  139 139  --   --   --   --   --   --  139 140   K  --   --  4.3  --   --   --  4.6 3.8  --   --   --   --   --   --  3.9 4.2   CL  --   --  101  --   --   --  101 101  --   --   --   --   --   --  100* 101   CO2  --   --  25  --   --   --  28 29  --   --   --   --   --   --  28 29   BUN  --   --  45*  --   --   --  35* 27*  --   --   --   --   --   --  38* 36*   CREAT  --   --  6.81*  --   --   --  6.29* 4.87*  --   --   --   --   --   --  7.30* 7.16*   GLU  --   --  115  --   --   --  94 101  --   --   --   --   --   --  105 97   CA  --   --  8.9  --   --   --  9.2 8.9  --   --   --   --   --   --  9.6 9.7   MG  --   --  2.1  --   --   --  2.0  --   --   --   --   --   --   --   --  2.0   PO4  --   --  4.8*  --   --   --  4.2  --   --   --   --   --   --   --   --  3.4   TBILI  --   --   --   --   --   --   --  0.8  --   --   --   --   --   --  0.7 0.7   AST  --   --   --   --   --   --   --  12  --   --   --   --   --   --  11 14   ALT  --   --   --   --   --   --   --  10  --   --   --   --   --   --  9* 8*   ALKP  --   --   --   --   --   --   --  50  --   --   --   --   --   --  46 47   ALB  --   --   --   --   --   --   --  2.1*  --   --   --   --   --   --  2.1*  --    TG  --   --  94  --   --   --   --   --   --   --   --   --   --   --  117  --    WBC 14.8* 15.3* 15.0* 15.9* 16.8*  --  17.1* 21.7*  --   --   --    < >  --   --  12.0* 11.5*   NEUTA  --   --   --   --   --   --   --  18.53*  --   --   --   --   --   --   --   --    HGB 8.4* 8.9* 8.1* 8.4* 8.4*  --  7.4* 8.1*  --   --   --    < >  --   --  7.9* 8.2*   HCT 26.1* 27.8* 25.9* 26.1* 26.3*  --  23.6* 25.5*  --   --   --    < >  --   --  24.9* 26.1*   PLT 267 262 239 236 247  --  230 283  --   --   --    < >  --   --  256 276   INR  --   --   --   --   --   --   1.5*  --   --   --   --   --   --   --   --  1.4*   PTT  --   --   --   --   --   --  36.0*  --   --   --   --   --   --   --   --  33.4*    < > = values in this interval not displayed.       Radiology Results  No results found.    Microbiology  Microbiology Results (last 24 hours)     Procedure Component Value Units Date/Time    Central Blood Culture [[id number]] Collected: 11/23/19 1657    Order Status: Completed Specimen: Central Blood Updated: 11/24/19 1440     Central Blood Culture No growth at 18 hours.    Peripheral Blood Culture [[id number]] Collected: 11/23/19 1711    Order Status: Completed Specimen: Peripheral Blood Updated: 11/24/19 1440     Peripheral Blood Culture No growth at 18 hours.          Other Studies  (e.g.: echo, cath, PFTs, etc.)  I have reviewed MRI and CT angio with NIR attending Dr Karene Fry and fellow    Scheduled Meds:   0.9% sodium chloride flush  3 mL Intravenous Q8H [place]    acetaminophen  1,000 mg Feeding Tube Q8H [place]    atorvastatin  40 mg Feeding Tube Q PM [place]    B-complex with vitamin C-Folic Acid  1 tablet Per OG Tube Daily [place]    heparin flush  300 Units Intravenous Daily [place]    insulin aspart  0-40 Units Subcutaneous Q4H [place] (Insulin)    lansoprazole  30 mg Feeding Tube Q AM Before Breakfast [place]    meropenem  500 mg Intravenous Q PM [place]    vancomycin  500 mg Intravenous After Hemodialysis    white petrolatum-mineral oil  1 Application Both Eyes 4x  Daily [facility]     Continuous Infusions:   dextrose 15 mL/hr (11/24/19 1600)    fentaNYL 100 mcg/hr (11/24/19 1600)    norEPINEPHrine      propofoL 15.0079 mcg/kg/min (11/24/19 1600)     PRN Meds:   sodium chloride bolus  250 mL Intravenous Q2 Min PRN    sodium chloride for dialysis circuit  100 mL Intravenous Q30 Min PRN    0.9% sodium chloride flush  3 mL Intravenous PRN    dextrose  0-150 mL/hr Intravenous Continuous PRN    dextrose 50%  6.25 g Intravenous Q15 Min PRN    fentaNYL citrate  25-50 mcg Intravenous Q1H PRN     glucose  20 g Oral Q15 Min PRN    heparin flush  300 Units Intravenous PRN    labetalol  10 mg Intravenous Q2H PRN    lidocaine   Topical Once PRN    lidocaine (PF)  0.2 mL Intradermal PRN    lidocaine (PF)  5 mL Subcutaneous PRN       Code Status: FULL      Assessment and [place] is a 64 y.o. 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   CT angio shows active extravasation from the right cervical internal carotid artery into right nasopharyngeal mass with large retropharyngeal hematoma extending  from the skull base to the C3 level,.    Nasopharynx packed by [facility]. Dr Joneen Caraway in the process of clarifying the options for care given the devastating nature of this invasive tumor.that has invaded the skull base (osteomyelitis) and right carotid artery (pseudoaneurysm).        Plan By System    Neuro: Previous strokes with progressive recent decline per his family. Unclear current baseline neuro exam and mental status   - intubated for airway protection  - sedated for comfort and to prevent agitation and hypertension given risk of rebleeding from nasopharyngeal mass  - fentanyl and propofol infusions   - will decrease fentanyl and increase propofol to assure comfort while allowing intermittent neuro exams  - BP goals SBP < 140 >100    CV:   -Normotensive  - -BP goal > 140  - labetalol first line  - atorvastatin  - norepi for BP support  - will attempt to obtain cardiac records , studies form OSH      Pulm:   - intubated for airway protection  -CMV while deeply sedated  - airway clearance per RT    GI:   NPO  Prevacid    Renal/GU: ESRD  -Dialysis per Nephrology       Heme/Onc:   - hgb 8.4 plt 267  - HGB goal > 7  - high risk for carotid bleed  - Type and screened  - 2 PC crossed    ID/Immuno/Rheum:   -Invasive nasopharyngeal tumor with skull base  osteomyelitis  Appreciate ID consultation and recommendations  - Vanco day 2  - Mero day1    Endo: Insulin sliding scale    Vascular Access  Venous (rationale for line; place/maintain/remove): Left IJ introducer  Arterial (rationale for line; place/maintain/remove): left radial a line    Mobilization  PT/OT Consult: No  Activity (e.g.: goal, restriction, brace, etc.): bed rest    Prophylaxis  Non-pharmacologic prophylaxis against DVT/PE: scd  Pharmacologic prophylaxis against DVT/PE: N/A  Pharmacologic prophylaxis against  stress ulcer: prevacid    Severity of Illness  Close monitoring from significant risk of clinical deterioration.  Need for mechanical ventilation.    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  Total Time Spent with Patient: 62

## 2019-11-24 NOTE — Nursing Note (Signed)
Pre Treatment Nursing Note  Pre treatment report received from [place], [place]  Pre treatment vitals: Wt 85.7 kg, BP 145/51, Pulse 67.  3 hr HD started in pt room via R-AVF. QB 400 ml/min, ordered UF 1-2 L as tolerated. N/S prime.    Medication Administration:    Blood Transfusion: No     Post Treatment Nursing Note  Post tx vitals: Wt 84.3 kg, BP 150/49, P 82.   Pt tolerated 3 hr Tx with net UF 1.5 L fluid removed. Skin injury on both  sites of AVF .   Treatment orders were adjusted during the run.  K bath changed to 3 K bath during treatment  The following  are the reactions/complications which occured during this treatment and how they were managed. Low BP during treatment, BP improved once goal was decreased.  Post treatment report given to: Janett Billow, RN

## 2019-11-24 NOTE — Consults (Signed)
NEPHROLOGY DIALYSIS NOTE    Interim history/events and pertinent physical exam elements  NAE    Indication for Dialysis  End stage renal disease    Dialysis Prescription      Treatment Type HD Only    HD Duration (Hrs) 3    UF goal for HD (kg) 1-2    K+ 3 mEq/L    Ca++ 2.5 mEq/L    Na+ 138 mEq/L    Bicarb 35 mEq/L    Dialysate Flow 2 x Autoflow    Dialysate Temperature (C) 37    Na+ Model Program None    UF Profile for HD None    Access Type AVF    BFR-As tolerated to a maximum of: 400 mL/min    Dialyzer F160 (83 mL)    Machine Lines Adult (108 mL)    Crit-line RN Discretion    Goal BP type SBP    Goal SBP> 90      Note regarding medications (e.g. antibiotics) to be given after dialysis: Vancomycin     Assessment and Plan    I personally evaluated and examined the patient on: hemodialysis    Electrolytes and volume well controlled.  Plan to continue hemodialysis TTS while admitted.    Barnie Alderman, MD  Nephrology [facility] [phone])  11/24/19    -----------------------------------------------------------------------------------------  Please see below for the appropriate Nephrology consult pager to reach [place]:  Location & Service Pager (all hours)   [facility] - all new and established consults [place] -  all new and established consults [phone]   [place] -  all new consults [phone]   [facility] Service - established consults [phone]   [facility] - established consults [phone]   [place] and established [phone]

## 2019-11-24 NOTE — Consults (Signed)
INFECTIOUS DISEASES INITIAL CONSULT NOTE - ATTENDING ONLY     My date of service is 11/24/2019.    Consult requested by the Medicine service for evaluation of skull base OM.    History of Present Illness  William Sherman is a 64 year old male with a history of DM, HTN, ESRD on HD, CVA (non-verbal and non-ambulatory at baseline admitted for large volume epistaxis and found to have a right nasal mass with extension into the skull base s/p intubation for airway protection, PRBC transfusion, and b/l Rhinorocket nasal packing. He was transferred to [facility] for further ENT evaluation.    Patient was intubated on upon arrival. He has remained afebrile. Labs are remarkable for Cr 7.16, WBC 11.5, Hb 8.2. Imaging including MR of face/neck revealed a large multi-spatial centrally necrotic mass on the right nasopharynx with intracranial extension into the right middle and posterior cranial fossa with intracranial extension including the skull base and middle cranial fossa along with involvement of the sphenoid sinuses and posterior ethmoid air cells; occlusion/stenosis of the distal cervical right internal jugular vein; and invasion of the right ICA wall. Follow-up CT angio revealed active extravasation from the right cervical internal carotid artery and to right nasopharyngeal mass with large retropharyngeal hematoma extending from the skull base. He was treated with vancomycin and Zosyn, and ID was called for further recommendations.    Of note, patient likely developed an ear infection back in 07/2019 that gradually worsened and has been evaluated in the outpatient setting for the nasal mass that has enlarged. HE has been on several courses of PO antibiotics in the outpatient setting over the past several months.    No past medical history on file.  No past surgical history on file.    Allergies/Contraindications  No Known Allergies     Medications  Scheduled Meds:   0.9% sodium chloride flush  3 mL Intravenous Q8H [place]     acetaminophen  1,000 mg Feeding Tube Q8H [place]    atorvastatin  40 mg Feeding Tube Q PM [place]    B-complex with vitamin C-Folic Acid  1 tablet Per OG Tube Daily [place]    heparin flush  300 Units Intravenous Daily [place]    insulin aspart  0-40 Units Subcutaneous Q4H [facility] (Insulin)    lansoprazole  30 mg Feeding Tube Q AM Before Breakfast [place]    meropenem  500 mg Intravenous Q PM [place]    vancomycin  500 mg Intravenous After Hemodialysis    white petrolatum-mineral oil  1 Application Both Eyes 4x Daily [place]     Continuous Infusions:   dextrose 15 mL/hr (11/24/19 1500)    fentaNYL 100 mcg/hr (11/24/19 1500)    norEPINEPHrine      propofoL 15.0079 mcg/kg/min (11/24/19 1500)       Antibiotic History  IV vancomycin 6/8 -   IV Zosyn 6/8 -     High Medication Toxicity Risk: Yes    Social History     Socioeconomic History    Marital status: Married     Spouse name: Not on file    Number of children: Not on file    Years of education: Not on file    Highest education level: Not on file     Social Determinants of Health     Financial Resource Strain:     Difficulty of Paying Living Expenses:    Food Insecurity:     Worried About Estate manager/land agent of Food in the Last Year:  Ran Out of Food in the Last Year:    Transportation Needs:     Film/video editor (Medical):     Lack of Transportation (Non-Medical):    Physical Activity:     Days of Exercise per Week:     Minutes of Exercise per Session:    Stress:     Feeling of Stress :    Social Connections:     Frequency of Communication with Friends and Family:     Frequency of Social Gatherings with Friends and Family:     Attends Religious Services:     Active Member of Clubs or Organizations:     Attends Music therapist:     Marital Status:    Intimate Partner Violence:     Fear of Current or Ex-Partner:     Emotionally Abused:     Physically Abused:     Sexually Abused:          Family History was reviewed and is non-contributory to this  illness.    Review of Systems  Unable to obtain since patient was intubated.    Vitals  Temp:  [36.3 C (97.3 F)-36.5 C (97.7 F)] 36.5 C (97.7 F)  Heart Rate:  [61-76] 76  *Resp:  [9-20] 10  BP: (167)/(51) 167/51  Arterial Line BP (mmHg) : (110-198)/(37-71) 158/51  FiO2 (%):  [30 %] 30 %  SpO2:  [94 %-100 %] 96 %      Intake/Output Summary (Last 24 hours) at 11/24/2019 1520  Last data filed at 11/24/2019 1500  Gross per 24 hour   Intake 2563.94 ml   Output 1900 ml   Net 663.94 ml       Physical Examination  General: intubated and sedated  HEENT: ETT in place; nasal packing in place  CV: RRR, no murmurs  Lungs: clear to auscultation anterolaterally  Abdomen: soft, non-distended  Extremities: AVF on RUE; no edema  Skin: no rashes or lesions  Neuro: sedated    Lines/tubes: RIJ, LIJ, Left femoral, foley    Data    CBC        11/24/19  1028 11/24/19  0410 11/23/19  2025 11/22/19  1856   WBC 15.3* 15.0* 15.9* 21.7*   HGB 8.9* 8.1* 8.4* 8.1*   HCT 27.8* 25.9* 26.1* 25.5*   PLT 262 239 236 283   NEUTA  --   --   --  18.53*   LYMA  --   --   --  1.28   MOA  --   --   --  1.15*   EOA  --   --   --  0.20   BASOA  --   --   --  0.06       Chem7        11/24/19  0410 11/23/19  0413 11/22/19  1856   NA 136 139 139   K 4.3 4.6 3.8   CL 101 101 101   CO2 _0 BUN 45* 35* 27*   CREAT 6.81* 6.29* 4.87*   GLU 115 94 101       Liver Panel        11/22/19  1856 11/22/19  0339 11/21/19  2304   AST _1 ALT 10 9* 8*   ALKP 50 46 47   TBILI 0.8 0.7 0.7   TP 6.0* 5.8*  --    ALB 2.1* 2.1*  --  CRP        11/24/19  0410   CRP 302.5*           Microbiology  Blood cultures (11/23/19): NGTD    Radiology Results   XR Chest 1 View (AP Portable)    Result Date: 11/22/2019  FINDINGS/IMPRESSION: Endotracheal tube tip is 2 cm above the carina. Enteric tube projects over the midline and terminates below the diaphragm. Left internal jugular catheter tip terminates in the midline brachiocephalic vein, likely less than 1 cm from the  superior vena cava lumen. Recommend advancement. Blunting of the left costophrenic angle which may represent effusion, atelectasis or infection. Remainder the lungs is clear. Cardiac and mediastinal contours unchanged without acute findings. Report dictated by: Luberta Mutter, MD, signed by: Luberta Mutter, MD Department of Radiology and Biomedical Imaging     XR Chest 1 View (AP Portable)    Result Date: 11/22/2019  FINDINGS/IMPRESSION: Endotracheal tube terminates 1.5 cm above the carina. Mild retrocardiac atelectasis but otherwise clear lungs. No pneumothorax or significant effusion. Normal heart and mediastinum for portable technique. Report dictated by: Denzil Hughes, MD, signed by: Denzil Hughes, MD Department of Radiology and Biomedical Imaging     CT Angio Brain/Neck for Stroke (Inc 3D Post-Processing and Perf)    Result Date: 11/23/2019  1.  Active extravasation from the right cervical internal carotid artery and to right nasopharyngeal mass with large retropharyngeal hematoma that extends from the skull base to the C3 level, likely secondary to invasion of the right ICA due to the nasopharyngeal mass. There is also right ICA invasion of the junction of the petrous and cavernous segment, as well as opacification of the cavernous sinus and arterial phase suspicious for carotid evaluation of the region of the cavernous sinus. 2.  Extensive nasopharyngeal mass with intracranial extension which is better described on same-day MRI. //Impression discussed with Dr. Simona Huh by Clifton Custard, MD (Radiology) on 11/23/2019 4:16 PM.//      MR Face,Naso,Neck with and without Contrast    Result Date: 11/23/2019  1.  Large multi-spatial centrally necrotic mass centered on the right nasopharynx with intracranial extension into the right middle and posterior cranial fossa. Superior extent of this tumor appears to the level of the sphenoid sinuses and cavernous sinus; inferior most extent appears  to be the parapharyngeal space at the level of the supraglottis. Contralateral extension of this tumor extends to involve the left nasopharynx towards the left carotid space. 2.  Several areas of extra-axial, intracranial extension including at the skull base and middle cranial fossa. Areas of perineural extension along the right foramen rotundum, ovale, and multiple skull base foramina. 3.  Both sphenoid sinuses and posterior ethmoid air cells are involved; remaining paranasal sinuses appear uninvolved. 4.  Likely short segment occlusion versus severe stenosis of the distal cervical right internal jugular vein and at the jugular bulb. Recommend attention on follow-up CTA. 5.  Invasion of the right ICA wall at the level of the petrous/cavernous junction with circumferential tumor thickening and enhancement and at least moderate narrowing. Attention on CTA to this area is also recommended. 6.  Multistation cervical lymph node involvement the largest including both retropharyngeal lymph nodes.      XR KUB, Flat Plate, Abdomen 1 View    Result Date: 11/22/2019  Lines/drains/medical devices: Nasogastric tube with the tip in the gastric body and the sidehole in the gastric fundus Bowel gas pattern is unremarkable, nonobstructive. Degenerative disc disease in the lumbar  spine Report dictated by: Clare Charon, MD, signed by: Clare Charon, MD Department of Radiology and Biomedical Imaging    I spoke with ENT and the ICU teams regarding my recommendations.    Assessment and Recommendations    William Sherman is a 64 y.o. 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     Her my discussion with ENT, the patient likely developed a severe ear infection with worsening and invasion in the skull base and surrounding structures. Pathogens that can cause this extensive disease include Staph aureus, Pseudomonas, and Fungal infections (aspergillous, mucor). Patient is at significantly high risk given his background of DM and ESRD requiring HD.     Biopsy and resection are associated with very high morbidity and mortality and not recommended by ENT at this time. Treatment with antibiotics alone in this setting will provide very minimal benefit, if at all. I recommend covering for Staph aureus (including MRSA) and Pseudomonas at this time. I prefer to hold off on starting amphotericin B due to his ESRD and lack of culture/path data to necessitate antifungal treatment at this time.    Therefore, I recommend vancomycin and meropenem.    I discussed my recommendations with the patient's son and daughter and explained that antibiotics alone will likely not provide any benefit and surgery would be very dangerous. Family wishes to have the patient transferred back to their local hospital for further care so that he can be close to other family members, since they live 3.5 hours of a drive away.     Recommendations  Dx:  -HIV Ag/Ab  -Serum beta-D-glucan and galactomannan (may be negative in mucor infection)    Tx:  -D/c Zosyn  -Continue IV vancomycin, dosing per pharmacy for goal 15-20  -IV meropenem 2 g Q8H        We will continue to follow with you.  Please page the [facility] ID Consult service pager ([place]) [phone] with questions.    Deatra Robinson, MD  11/24/2019

## 2019-11-24 NOTE — Plan of Care (Signed)
On 3 K bath for 3 hours hemodialysis. Pre K 4.3 mmol/L.

## 2019-11-25 DIAGNOSIS — D631 Anemia in chronic kidney disease: Secondary | ICD-10-CM

## 2019-11-25 LAB — BASIC METABOLIC PANEL (NA, K,
Anion Gap: 9 (ref 4–14)
Calcium, total, Serum / Plasma: 8.6 mg/dL (ref 8.4–10.5)
Carbon Dioxide, Total: 27 mmol/L (ref 22–29)
Chloride, Serum / Plasma: 101 mmol/L (ref 101–110)
Creatinine: 4.76 mg/dL — ABNORMAL HIGH (ref 0.73–1.24)
Glucose, non-fasting: 88 mg/dL (ref 70–199)
Potassium, Serum / Plasma: 3.9 mmol/L (ref 3.5–5.0)
Sodium, Serum / Plasma: 137 mmol/L (ref 135–145)
Urea Nitrogen, Serum / Plasma: 27 mg/dL — ABNORMAL HIGH (ref 7–25)
eGFR - high estimate: 14 mL/min — ABNORMAL LOW (ref >59)
eGFR - low estimate: 12 mL/min — ABNORMAL LOW (ref >59)

## 2019-11-25 LAB — MAGNESIUM, SERUM / PLASMA: Magnesium, Serum / Plasma: 1.8 mg/dL (ref 1.6–2.6)

## 2019-11-25 LAB — COMPLETE BLOOD COUNT
Hematocrit: 25.5 % — ABNORMAL LOW (ref 41.0–53.0)
Hematocrit: 25.6 % — ABNORMAL LOW (ref 41.0–53.0)
Hematocrit: 25.8 % — ABNORMAL LOW (ref 41.0–53.0)
Hematocrit: 26.1 % — ABNORMAL LOW (ref 41.0–53.0)
Hemoglobin: 8 g/dL — ABNORMAL LOW (ref 13.6–17.5)
Hemoglobin: 8.1 g/dL — ABNORMAL LOW (ref 13.6–17.5)
Hemoglobin: 8.1 g/dL — ABNORMAL LOW (ref 13.6–17.5)
Hemoglobin: 8.1 g/dL — ABNORMAL LOW (ref 13.6–17.5)
MCH: 28.6 pg (ref 26.0–34.0)
MCH: 28.8 pg (ref 26.0–34.0)
MCH: 28.9 pg (ref 26.0–34.0)
MCH: 29.1 pg (ref 26.0–34.0)
MCHC: 30.7 g/dL — ABNORMAL LOW (ref 31.0–36.0)
MCHC: 31.4 g/dL (ref 31.0–36.0)
MCHC: 31.6 g/dL (ref 31.0–36.0)
MCHC: 31.8 g/dL (ref 31.0–36.0)
MCV: 91 fL (ref 80–100)
MCV: 91 fL (ref 80–100)
MCV: 93 fL (ref 80–100)
MCV: 93 fL (ref 80–100)
Platelet Count: 265 10*9/L (ref 140–450)
Platelet Count: 275 10*9/L (ref 140–450)
Platelet Count: 279 10*9/L (ref 140–450)
Platelet Count: 280 10*9/L (ref 140–450)
RBC Count: 2.78 10*12/L — ABNORMAL LOW (ref 4.40–5.90)
RBC Count: 2.8 10*12/L — ABNORMAL LOW (ref 4.40–5.90)
RBC Count: 2.8 10*12/L — ABNORMAL LOW (ref 4.40–5.90)
RBC Count: 2.81 10*12/L — ABNORMAL LOW (ref 4.40–5.90)
WBC Count: 13.5 10*9/L — ABNORMAL HIGH (ref 3.4–10.0)
WBC Count: 14.3 10*9/L — ABNORMAL HIGH (ref 3.4–10.0)
WBC Count: 14.7 10*9/L — ABNORMAL HIGH (ref 3.4–10.0)
WBC Count: 15.7 10*9/L — ABNORMAL HIGH (ref 3.4–10.0)

## 2019-11-25 LAB — COVID-19 RNA, RT-PCR/NUCLEIC A: COVID-19 RNA, RT-PCR/Nucleic A: NOT DETECTED

## 2019-11-25 LAB — ARTERIAL BLOOD GAS ONLY
Base excess: 6 mmol/L
Base excess: 6 mmol/L
Bicarbonate: 31 mmol/L — ABNORMAL HIGH (ref 23–29)
Bicarbonate: 30 mmol/L — ABNORMAL HIGH (ref 23–29)
FIO2: 30 % (ref 20–100)
FIO2: 30 % (ref 20–100)
Oxygen Saturation: 96 % (ref 95–100)
Oxygen Saturation: 95 % (ref 95–100)
PCO2: 46 mm Hg (ref 32–46)
PCO2: 46 mm Hg (ref 32–46)
PO2: 73 mm Hg — ABNORMAL LOW (ref 83–108)
PO2: 70 mm Hg — ABNORMAL LOW (ref 83–108)
pH, Blood: 7.43 (ref 7.35–7.45)
pH, Blood: 7.43 (ref 7.35–7.45)

## 2019-11-25 LAB — POCT GLUCOSE
Glucose, iSTAT: 94 mg/dL (ref 70–199)
Glucose, iSTAT: 97 mg/dL (ref 70–199)
Glucose, iSTAT: 91 mg/dL (ref 70–199)
Glucose, iSTAT: 97 mg/dL (ref 70–199)
Glucose, iSTAT: 89 mg/dL (ref 70–199)
Glucose, iSTAT: 91 mg/dL (ref 70–199)

## 2019-11-25 LAB — PHOSPHORUS, SERUM / PLASMA: Phosphorus, Serum / Plasma: 3.3 mg/dL (ref 2.3–4.7)

## 2019-11-25 MED ORDER — HYDRALAZINE 20 MG/ML INJECTION SOLUTION
20 mg/mL | INTRAMUSCULAR | Status: DC | PRN
  Administered 2019-11-25 – 2019-11-29 (×13): 5 mg via INTRAVENOUS

## 2019-11-25 MED ORDER — SODIUM DI- AND MONOPHOSPHATE-POTASSIUM PHOS MONOBASIC 250 MG TABLET
250 | Freq: Four times a day (QID) | ORAL | Status: AC
Start: 2019-11-25 — End: 2019-11-26
  Administered 2019-11-26 (×2): 2 via ORAL

## 2019-11-25 MED FILL — ACETAMINOPHEN 500 MG TABLET: 500 mg | ORAL | Qty: 2

## 2019-11-25 MED FILL — DIPRIVAN 10 MG/ML INTRAVENOUS EMULSION: 10 mg/mL | INTRAVENOUS | Qty: 100

## 2019-11-25 MED FILL — NEPHRO-VITE 0.8 MG TABLET: 0.8 mg | ORAL | Qty: 1

## 2019-11-25 MED FILL — FENTANYL (PF) 10 MCG/ML IN 0.9 % SODIUM CHLORIDE INTRAVENOUS: 2500 mcg/250 mL (10 mcg/mL) | INTRAVENOUS | Qty: 250

## 2019-11-25 MED FILL — HYDRALAZINE 20 MG/ML INJECTION SOLUTION: 20 mg/mL | INTRAMUSCULAR | Qty: 1

## 2019-11-25 MED FILL — ATORVASTATIN 40 MG TABLET: 40 mg | ORAL | Qty: 1

## 2019-11-25 MED FILL — LANSOPRAZOLE 30 MG DELAYED RELEASE,DISINTEGRATING TABLET: 30 mg | ORAL | Qty: 1

## 2019-11-25 MED FILL — LABETALOL 5 MG/ML INTRAVENOUS SOLUTION: 5 mg/mL | INTRAVENOUS | Qty: 20

## 2019-11-25 MED FILL — MEROPENEM 500 MG INTRAVENOUS SOLUTION: 500 mg | INTRAVENOUS | Qty: 500

## 2019-11-25 MED FILL — HEPARIN LOCK FLUSH (PORCINE) (PF) 100 UNIT/ML INTRAVENOUS SYRINGE: 100 unit/mL | INTRAVENOUS | Qty: 3

## 2019-11-25 NOTE — Other (Signed)
Advance Care Planning   Discussed with: Verdis Frederickson, Mr. Ice wife, daughter Basilia Jumbo, family members Beverlee Nims, Jacqulyn Bath and Piatt sustaining treatment preferences (i.e. Code): DNR, continue intubation and supportive care for now    Additional preferences of care: Discussed Mr. Lightsey condition with his wife Verdis Frederickson, daughter Basilia Jumbo, and family members [place], [place] and [place] at the bedside, along with Dr. Joneen Caraway from [facility] and Dr. Cassandria Santee, anesthesia resident. Per previous discussions, the family is aware of the life threatening process involving his skull, nasopharynx, right internal jugular and right ICA with evidence pseudoaneurysm with active extravasation. Given his grim prognosis and very limited and morbid treatment options, his wife Verdis Frederickson and family decided that the primary focus of his care should be comfort. However, his family is hopeful that he can be transferred to a hospital closer to home so he can be visited by family as he transitions. I discussed with his wife Verdis Frederickson and family that given his critical status, he may suffer a complication at any time including hemorrhage and if his heart were to stop CPR would be futile and likely cause him pain and discomfort. His wife and family agreed that in that case we would nor pursue heroic measures including no chest compressions, shocks, or massive resuscitation and would focus on comfort. Code status will be changed to DNR. Will continue intubation and current supportive measures in hopes he can be transferred to a hospital near his family to pursue comfort care.     Changes to treatment plan as a result of this conversation: Code status changed to DNR. Will continue intubation and supportive care for now with hopes of being able to transfer him to a facility closer to family.    Apolinar Junes, MD, [facility], [place]

## 2019-11-25 NOTE — Progress Notes (Signed)
CRITICAL CARE PROGRESS NOTE - ATTENDING ONLY     11/25/2019    24 Hour Course  No acute events  Dialyzed 1.9 liters    Subjective  Intubated sedated    Vitals  (min & max of the last 24-hours or since hospital admission; most recent)  HR: Pulse  Min: 67  Max: 76; Heart Rate: 67 (11/25/19 1400)  ABP: Arterial Line BP (mmHg)   Min: 106/49  Max: 171/57; 137/46 (11/25/19 1400)  AMAP: Arterial Line MAP (mmHg)  Min: 62 mmHg  Max: 96 mmHg; 76 mmHg (11/25/19 1400)  NBP: BP  Min: 131/45  Max: 131/45; (!) 131/45 (11/24/19 2000)  NMAP: MAP (mmHg)  Min: 71 mmHg  Max: 71 mmHg; 71 mmHg (11/24/19 2000)  RR: Resp  Min: 10  Max: 14; (!) 11 (11/25/19 1400)  SpO2: SpO2  Min: 93 %  Max: 98 %; 94 % (11/25/19 1400)  Temp: Temp  Min: 36 C (96.8 F)  Max: 37.3 C (99.1 F); 36.6 C (97.9 F) (11/25/19 1200)    Intake/Output  (from 0700 yesterday to 0700 today)  Net - 476 cc (dialysis 1.9 liters)    Physical Exam, Data and Advanced Monitoring/Therapies  Neuro: intubated sedated no response to tactile stimulation  CV: RRR  Pulm: intubated. CMV peep 5 30%  GI: soft Nontender   Renal/GU: 1.9 liters out net negative 476  Heme/Onc: hgb 8.1 stable  Plt 279  ID/Immuno/Rheum:WBC 14.7 stable  Endo: glu 96-105    Lines (site, type, placement date): RUE AVF  Left IJ introducer L radial a line  Activity in the last 24-hours (e.g.: ambulated, out of bed to chair, sat on edge of bed, bed-bound, etc.): bedrest    Labs        11/25/19  0952 11/25/19  0434 11/25/19  0431 11/24/19  2109 11/24/19  1543 11/24/19  1028 11/24/19  0410 11/23/19  2025 11/23/19  0414 11/23/19  0413 11/22/19  1856 11/22/19  1855 11/22/19  1855 11/22/19  1740 11/22/19  1648   PH37  --   --  7.43 7.43  --   --  7.40 7.42 7.45  --   --   --  7.38   < > 7.40   PCO2  --   --  46 46  --   --  46 44 43  --   --   --  54*   < > 52*   PO2  --   --  70* 73*  --   --  86 90 81*  --   --   --  88   < > 59*   HCO3  --   --  30* 31*  --   --  29 28 30*  --   --   --  32*   < > 32*   SAO2  --   --   95 96  --   --  97 98 97  --   --   --  97   < > 89*   LACTWB  --   --   --   --   --   --   --   --   --   --   --   --  1.1  --  0.6   NA  --  137  --   --   --   --  136  --   --  139 139  --   --   --   --  K  --  3.9  --   --   --   --  4.3  --   --  4.6 3.8  --   --   --   --    CL  --  101  --   --   --   --  101  --   --  101 101  --   --   --   --    CO2  --  27  --   --   --   --  25  --   --  28 29  --   --   --   --    BUN  --  27*  --   --   --   --  45*  --   --  35* 27*  --   --   --   --    CREAT  --  4.76*  --   --   --   --  6.81*  --   --  6.29* 4.87*  --   --   --   --    GLU  --  88  --   --   --   --  115  --   --  94 101  --   --   --   --    CA  --  8.6  --   --   --   --  8.9  --   --  9.2 8.9  --   --   --   --    MG  --  1.8  --   --   --   --  2.1  --   --  2.0  --   --   --   --   --    PO4  --  3.3  --   --   --   --  4.8*  --   --  4.2  --   --   --   --   --    TBILI  --   --   --   --   --   --   --   --   --   --  0.8  --   --   --   --    AST  --   --   --   --   --   --   --   --   --   --  12  --   --   --   --    ALT  --   --   --   --   --   --   --   --   --   --  10  --   --   --   --    ALKP  --   --   --   --   --   --   --   --   --   --  50  --   --   --   --    ALB  --   --   --   --   --   --   --   --   --   --  2.1*  --   --   --   --    TG  --   --   --   --   --   --  94  --   --   --   --   --   --   --   --    WBC 14.7* 14.3*  --  13.5* 14.8* 15.3* 15.0* 15.9*  --  17.1* 21.7*   < >  --   --   --    NEUTA  --   --   --   --   --   --   --   --   --   --  18.53*  --   --   --   --    HGB 8.1* 8.1*  --  8.1* 8.4* 8.9* 8.1* 8.4*  --  7.4* 8.1*   < >  --   --   --    HCT 25.8* 25.5*  --  25.6* 26.1* 27.8* 25.9* 26.1*  --  23.6* 25.5*   < >  --   --   --    PLT 279 280  --  265 267 262 239 236  --  230 283   < >  --   --   --    INR  --   --   --   --   --   --   --   --   --  1.5*  --   --   --   --   --    PTT  --   --   --   --   --   --   --   --   --  36.0*  --    --   --   --   --     < > = values in this interval not displayed.       Radiology Results  No results found.    Microbiology  Microbiology Results (last 24 hours)     Procedure Component Value Units Date/Time    Central Blood Culture [[id number]] Collected: 11/23/19 1657    Order Status: Completed Specimen: Central Blood Updated: 11/25/19 0622     Central Blood Culture No growth 2 days.    Peripheral Blood Culture [[id number]] Collected: 11/23/19 1711    Order Status: Completed Specimen: Peripheral Blood Updated: 11/25/19 9326     Peripheral Blood Culture No growth 2 days.          Other Studies  (e.g.: echo, cath, PFTs, etc.)  I have reviewed MRI and CT angio with NIR attending Dr Karene Fry and fellow    Scheduled Meds:   0.9% sodium chloride flush  3 mL Intravenous Q8H [place]    acetaminophen  1,000 mg Feeding Tube Q8H [place]    atorvastatin  40 mg Feeding Tube Q PM [place]    B-complex with vitamin C-Folic Acid  1 tablet Per OG Tube Daily [place]    heparin flush  300 Units Intravenous Daily [place]    insulin aspart  0-40 Units Subcutaneous Q4H [place] (Insulin)    lansoprazole  30 mg Feeding Tube Q AM Before Breakfast [place]    meropenem  500 mg Intravenous Q PM [place]    vancomycin  500 mg Intravenous After Hemodialysis    white petrolatum-mineral oil  1 Application Both Eyes 4x Daily [place]     Continuous Infusions:   dextrose 15 mL/hr (11/25/19 1400)    fentaNYL 25 mcg/hr (11/25/19 1400)    norEPINEPHrine      propofoL 30.0158 mcg/kg/min (11/25/19 1400)     PRN Meds:  sodium chloride bolus  250 mL Intravenous Q2 Min PRN    0.9% sodium chloride flush  3 mL Intravenous PRN    dextrose  0-150 mL/hr Intravenous Continuous PRN    dextrose 50%  6.25 g Intravenous Q15 Min PRN    fentaNYL citrate  25-50 mcg Intravenous Q1H PRN    glucose  20 g Oral Q15 Min PRN    heparin flush  300 Units Intravenous PRN    hydrALAZINE  5 mg Intravenous Q2H PRN    labetalol  10 mg Intravenous Q2H PRN    lidocaine   Topical Once PRN     lidocaine (PF)  5 mL Subcutaneous PRN       Code Status: UnCosigned      Assessment and Plan  William Sherman is a 64 y.o. 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   CT angio shows active extravasation from the right cervical internal carotid artery into right nasopharyngeal mass with large retropharyngeal hematoma extending from the skull base to the C3 level,.    Nasopharynx packed by [facility]. Sadly there are no therapeutic options that can be offered to treat the tumor given the extensive extent of the mass that has invaded the skull base (osteomyelitis) and right carotid artery (pseudoaneurysm). Given catistrophic nature of his illness and lack of therapeutic options Mr Brauer family has decided to focus on comfort with hope of transferring patient closer to home. In the case of cardiopulmonary arrest and/or bleed he will not be resuscitated.         Plan By System  Neuro: Case reviewed extensively by [facility], neuroradiology, interventional radiology, and infectious disease services  - Discussion with family today led by Dr Joneen Caraway led to revising goals of care to comfort measures with the goal of transferring to hospital closer to home   - intubated for airway protection  - sedated for comfort and to prevent agitation and hypertension given risk of rebleeding from nasopharyngeal mass  - fentanyl and propofol infusions   - BP goals SBP < 140     CV:   -Normotensive  - Will manage BP with antihypertensives as needed  - may use low dose norepinephrine infusion to maintain BP with goal of transfer to hospital close to home     Pulm:   -continue intubation and mechanical ventilation    GI:   Prevacid    Renal/GU: ESRD  -Continue Dialysis per Nephrology       Heme/Onc:   - HGB goal > 7  - high risk for carotid bleed    ID/Immuno/Rheum:   -Invasive nasopharyngeal tumor  with skull base osteomyelitis  Continue vancomycin and meropenem    Endo: Insulin sliding scale    Vascular Access  Venous (rationale for line; place/maintain/remove): Left IJ introducer  Arterial (rationale for line; place/maintain/remove): left radial a line    Mobilization  PT/OT Consult: No  Activity (e.g.: goal, restriction, brace, etc.): bed rest    Prophylaxis  Non-pharmacologic prophylaxis against DVT/PE: scd  Pharmacologic prophylaxis against DVT/PE: N/A  Pharmacologic prophylaxis against stress ulcer: prevacid    Severity of Illness  Close monitoring from significant risk of clinical deterioration.  Need for mechanical ventilation.    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    Phylis Bougie, MD  Critical Care Medicine Attending Physician  My date of service is 11/25/2019.  Total Time Spent with Patient: 32

## 2019-11-25 NOTE — Consults (Signed)
NEPHROLOGY FOLLOW UP CONSULT NOTE     Events  Dialysis with 1.5L removed    Subjective  Intubated and sedated    Review of Systems    Review of systems unobtainable due to intubated    Vitals  Temp:  [36 C (96.8 F)-36.6 C (97.9 F)] 36.6 C (97.9 F)  Heart Rate:  [67-74] 67  *Resp:  [10-18] 13  BP: (131)/(45) 131/45  Arterial Line BP (mmHg) : (112-171)/(38-57) 124/43  FiO2 (%):  [30 %-40 %] 40 %  SpO2:  [92 %-98 %] 92 %    Most Recent Weight: 86 kg (189 lb 9.5 oz)  Admission Weight: (S) 84.4 kg (186 lb 1.1 oz)      Intake/Output Summary (Last 24 hours) at 11/25/2019 1728  Last data filed at 11/25/2019 1700  Gross per 24 hour   Intake 954.86 ml   Output 0 ml   Net 954.86 ml       Current IP Medications  Scheduled Meds:   0.9% sodium chloride flush  3 mL Intravenous Q8H [place]    acetaminophen  1,000 mg Feeding Tube Q8H [place]    atorvastatin  40 mg Feeding Tube Q PM [place]    B-complex with vitamin C-Folic Acid  1 tablet Per OG Tube Daily [place]    heparin flush  300 Units Intravenous Daily [place]    insulin aspart  0-40 Units Subcutaneous Q4H [facility] (Insulin)    lansoprazole  30 mg Feeding Tube Q AM Before Breakfast [place]    meropenem  500 mg Intravenous Q PM [place]    vancomycin  500 mg Intravenous After Hemodialysis    white petrolatum-mineral oil  1 Application Both Eyes 4x Daily [place]     Continuous Infusions:   dextrose Stopped (11/25/19 1722)    fentaNYL 25 mcg/hr (11/25/19 1700)    norEPINEPHrine      propofoL 30.0158 mcg/kg/min (11/25/19 1700)     PRN Meds:   sodium chloride bolus  250 mL Intravenous Q2 Min PRN    0.9% sodium chloride flush  3 mL Intravenous PRN    dextrose  0-150 mL/hr Intravenous Continuous PRN    dextrose 50%  6.25 g Intravenous Q15 Min PRN    fentaNYL citrate  25-50 mcg Intravenous Q1H PRN    glucose  20 g Oral Q15 Min PRN    heparin flush  300 Units Intravenous PRN    hydrALAZINE  5 mg Intravenous Q2H PRN    labetalol  10 mg Intravenous Q2H PRN    lidocaine   Topical Once PRN     lidocaine (PF)  5 mL Subcutaneous PRN       Physical Exam  General: Intubated and sedated  Head: Normocephalic, atraumatic  Eyes: Normal conjunctivae  ENMT: Rhino rockets in place  Lungs: Lungs clear to auscultation bilaterally  Cards: Regular rate & rhythm; normal s1/s2  Abdomen: Soft abdomen, non-tender, non-distended  MSK: No edema, clubbing or cyanosis  Derm: No obvious rash or jaundice  Neuro: Sedated  Psych:  Sedated  Hematologic: No excessive bruising note      Data  All labs available in last 3 days in [facility] reviewed.          11/25/19  1612 11/25/19  0952 11/25/19  0434 11/25/19  0431 11/24/19  2109 11/24/19  0410 11/23/19  0414 11/23/19  0413   WBC 15.7* 14.7* 14.3*  --  13.5* 15.0*   < > 17.1*   HCT 26.1* 25.8* 25.5*  --  25.6* 25.9*   < > 23.6*   PLT 275 279 280  --  265 239   < > 230   NA  --   --  137  --   --  136  --  139   K  --   --  3.9  --   --  4.3  --  4.6   CL  --   --  101  --   --  101  --  101   CO2  --   --  27  --   --  25  --  28   BUN  --   --  27*  --   --  45*  --  35*   CREAT  --   --  4.76*  --   --  6.81*  --  6.29*   GLU  --   --  88  --   --  115  --  94   CA  --   --  8.6  --   --  8.9  --  9.2   MG  --   --  1.8  --   --  2.1  --  2.0   PO4  --   --  3.3  --   --  4.8*  --  4.2   PH37  --   --   --  7.43 7.43 7.40  --   --    PCO2  --   --   --  46 46 46  --   --    PO2  --   --   --  70* 73* 86  --   --     < > = values in this interval not displayed.             Assessment and Recommendations  35M w/ ESKD on HD TTS via AVF, diabetes, prior stroke, w/ right nasopharyngeal tumor who presents after high volume epistaxis with hemoglobin drop and requiring intubation for airway protection.    Non-dialysis related recommendations  #ESKD  -Plan HD TTS while admitted    #HTN  Well controlled off anti-HTN  -Continue to hold home anti-htn    #Volume  Euvolemic on exam  -Will aim to keep net even    #Hyperkalemia  Potassium well controlled with dialysis  -Low potassium diet if taking  PO/tube feeds    #Anemia  Hemoglobin 7.  ON 15K units of EPO/week as an outpatient which ~ 49mg darbepoetin.  -S/p 1064m darbepoetin 6/8    #Mineral Bone Disease  Calcium and phos normal.  On hecterol 4.77m33mqHD~zemplar 51m36m - Ordered x1 oral phos, given NPO and dialyzing, to prevent iatrogenic hypophosphatemia    #Medication dosing for kidney failure on iHD  -Mero 500mg54m appropriate  -Vancomycing 500mg 51m HD appropriate      GabrieBarnie AldermanNephrology Gold Service ([phone]    -----------------------------------------------------------------------------------------  Please see below for the appropriate Nephrology consult pager to reach us:  LK[place]ation & Service Pager (all hours)   [facility] new and established consults [phone] new and established consults [phone] -  all new consults [phone] [facility] - established consults [phone] - established consults [phone] and established [phone]

## 2019-11-25 NOTE — Progress Notes (Signed)
This is a shared service.     OTOLARYNGOLOGY - HEAD AND NECK SURGERY  History and Physical/Consult Note    Chief Complaint: high volume epistaxis    Requesting Provider: Donivan Scull MD    Requesting Service: ICU    History of Present Illness: William Sherman is a 64 y.o. 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     --- For other relevant health data, including Allergies, Home medications, and Health history, please see addendum of note ---    Objective:  BP (!) 131/45 (Patient Position: Lying)   Pulse 67   Temp 36.6 C (97.9 F) (Oral)   Resp (!) 11   Ht 172.7 cm (_0 )   Wt 86 kg (189 lb 9.5 oz)   SpO2 94%   BMI 28.83 kg/m      Vent settings:  Mode: CMV (06/11 1140)  RR Set: 10 (06/11 1140)  Flow Rate Set (lpm): 48 (06/11 1140)  Tidal Volume Set: 450 mL (06/11 1140)  PEEP/CPAP Set (cm H2O): 5 cm H2O (06/11 1140)  FiO2 (%): 40 % (06/11 1400)        Intake/Output Summary (Last 24 hours) at 11/25/2019 1423  Last data filed at 11/25/2019 1400  Gross per 24 hour   Intake 952.23 ml   Output 0 ml   Net 952.23 ml       Physical Exam:  General: Ill appearing man, intubated  Neurological:  sedated   Cardio/Pulmonary: mechanically ventilated  HEENT:      Head/Face: NCAT, no worrisome face lesions     Eyes: left pupil and iris opaque, right pupil and iris dark brown     Ears: External ears normal-appearing     Nose: External nose normal-appearing, packed bilaterally with rhinorockets     Oral cavity: 8.0 ETT in place      Neck: thick wide neck, supple, flat, trachea midline, no cervical or supraclavicular lymphadenopathy      Neuro: sedated     Selected Results:  I have reviewed the following laboratory data --        11/25/19  0952 11/25/19  0434 11/24/19  2109   WBC 14.7* 14.3* 13.5*   HCT 25.8* 25.5* 25.6*   PLT 279 280 265           11/25/19  0434 11/24/19  0410 11/23/19  0413   NA 137 136 139   K 3.9 4.3 4.6   CL 101 101 101   CO2 _1 CREAT 4.76* 6.81* 6.29*   GLU 88 115 94   MG 1.8 2.1 2.0   PO4 3.3 4.8* 4.2   PT  --   --  17.4*   INR  --   --  1.5*   PTT  --   --  36.0*       Lab Results   Component Value Date    WBC Count 14.7 (H) 11/25/2019    Hemoglobin 8.1 (L) 11/25/2019    Hematocrit 25.8 (L) 11/25/2019    MCV 93 11/25/2019    Platelet Count 279 11/25/2019     Lab  Results   Component Value Date    NA 137 11/25/2019    K 3.9 11/25/2019    CL 101 11/25/2019    CO2 27 11/25/2019    BUN 27 (H) 11/25/2019    CREAT 4.76 (H) 11/25/2019    GLU 88 11/25/2019     Lab Results   Component Value Date    Calcium, total, Serum / Plasma 8.6 11/25/2019    Phosphorus, Serum / Plasma 3.3 11/25/2019     Lab Results   Component Value Date    Magnesium, Serum / Plasma 1.8 11/25/2019     Lab Results   Component Value Date    Alanine transaminase 10 11/22/2019    AST 12 11/22/2019    Alkaline Phosphatase 50 11/22/2019    Bilirubin, Total 0.8 11/22/2019     Lab Results   Component Value Date    Int'l Normaliz Ratio 1.5 (H) 11/23/2019    PT 17.4 (H) 11/23/2019     Lab Results   Component Value Date    Activated Partial Thromboplastin Time 36.0 (H) 11/23/2019     No results found for: CK, MBMU, TRPI, TRIPZ  No results found for: POCTLEUK, POCTNITRITE, POCTPROTEIN, POCTPHUR, POCTRBCUR, POCTSPECGRAV, POCTKETONESU, POCTBILIUR, POCTGLUCUR      Review of Other Relevant Data:  Radiology Results  No results found.    The plan regarding William Sherman was discussed with [place] Attending Surgeons, Drs. Caron Presume and Christeen Douglas.    Assessment and Plan:  William Sherman ([id number]) - 1356/1356-12  64 y.o. male with history of DM, HTN, ESRD on HD, CVA (non-verbal, blind, deaf  and non-ambulatory at baseline) admitted for large volume epistaxis and found to have a right nasal mass with extension into the skull base who presents as OSH transfer following episode of high volume epistaxis requiring bilateral rhinorocket placement. 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 Per discussion with OSH [facility] doc/ID revealed that the patient likely developed a severe ear infection with worsening and invasion in the skull base and surrounding structures. Pathogens that can cause this extensive disease include Staph aureus, Pseudomonas, and Fungal infections (aspergillous, mucor). Biopsy and resection of this area is not recommended by [facility] at this time given high morbidity and mortality associated with this intervention. Given all this information family would like to pursue focus on comfort care for William Sherman. Code status changed to DNR. Will continue intubation and supportive care for now with hopes of being able to transfer him to a facility closer to family for comfort care.    Recommend  - appreciate excellent global care per primary team  - [facility] will continue to follow while in house. Please alert [facility] to any acute changes in clinical status.       11/25/19    Code Status:   Full Code    --- Please page/call the Otolaryngology - Head & Neck Surgery Consult service pager with any questions ---      Additional Medical History    Medications Prior to Admission:  Scheduled Meds:   0.9% sodium chloride flush  3 mL Intravenous Q8H [place]    acetaminophen  1,000 mg Feeding Tube Q8H [place]    atorvastatin  40 mg Feeding Tube Q PM [place]    B-complex with vitamin C-Folic Acid  1 tablet Per OG Tube Daily [facility]  heparin flush  300 Units Intravenous Daily [place]    insulin aspart  0-40 Units Subcutaneous Q4H [facility] (Insulin)    lansoprazole  30  mg Feeding Tube Q AM Before Breakfast [place]    meropenem  500 mg Intravenous Q PM [place]    vancomycin  500 mg Intravenous After Hemodialysis    white petrolatum-mineral oil  1 Application Both Eyes 4x Daily [place]     Continuous Infusions:   dextrose 15 mL/hr (11/25/19 1400)    fentaNYL 25 mcg/hr (11/25/19 1400)    norEPINEPHrine      propofoL 30.0158 mcg/kg/min (11/25/19 1400)     PRN Meds:   sodium chloride bolus  250 mL Intravenous Q2 Min PRN    0.9% sodium chloride flush  3 mL Intravenous PRN    dextrose  0-150 mL/hr Intravenous Continuous PRN    dextrose 50%  6.25 g Intravenous Q15 Min PRN    fentaNYL citrate  25-50 mcg Intravenous Q1H PRN    glucose  20 g Oral Q15 Min PRN    heparin flush  300 Units Intravenous PRN    hydrALAZINE  5 mg Intravenous Q2H PRN    labetalol  10 mg Intravenous Q2H PRN    lidocaine   Topical Once PRN    lidocaine (PF)  5 mL Subcutaneous PRN       Allergies:  Allergies/Contraindications  No Known Allergies    Past Medical History:  No past medical history on file.    Past Surgical History:  No past surgical history on file.    Social History:  Social History     Socioeconomic History    Marital status: Married     Spouse name: Not on file    Number of children: Not on file    Years of education: Not on file    Highest education level: Not on file   Occupational History    Not on file   Tobacco Use    Smoking status: Not on file   Substance and Sexual Activity    Alcohol use: Not on file    Drug use: Not on file    Sexual activity: Not on file   Other Topics Concern    Not on file   Social History Narrative    Not on file     Social Determinants of Health     Financial Resource Strain:     Difficulty of Paying Living Expenses:    Food Insecurity:     Worried About [place] in the Last Year:     [place] in the Last Year:    Transportation Needs:     Film/video editor (Medical):     Lack of Transportation (Non-Medical):    Physical  Activity:     Days of Exercise per Week:     Minutes of Exercise per Session:    Stress:     Feeling of Stress :    Social Connections:     Frequency of Communication with Friends and Family:     Frequency of Social Gatherings with Friends and Family:     Attends Religious Services:     Active Member of Clubs or Organizations:     Attends Archivist Meetings:     Marital Status:    Intimate Partner Violence:     Fear of Current or Ex-Partner:     Emotionally Abused:     Physically Abused:  Sexually Abused:       Social history was reviewed and is non-contributory to this illness.    Family History:     Family history was reviewed and is non-contributory to this illness.

## 2019-11-25 NOTE — Interdisciplinary (Signed)
DISCHARGE PLAN HANDOFF NOTE    - Anticipated day and date of discharge: Saturday, 6/12  - Time-sensitive tasks: No  - D/C plan: Transfer back to OSH (ICU to ICU) to be closer to home. No TBA as pt transferred from OSH ED but OSH willing to take back.   - Acute to Acute Transfers:          -Reason: Transfer back to sending hospital: Tertiary/quaternary care completed (no treatment options available). Family wants pt closer to home for comfort care.         -Are any specialty services needed at OSH? No         -Patient's family agree with transfer? Yes (per ICU fellow Dr. Shelia Media)         -Level of care: ICU - intubated         -POLST Completed: No  - Transportation: ALS vs. CCT ambulance? PCS form:  Needs to be completed  - CD needed? Yes Ordered? No  - Authorization needed? No  - Orders complete? No  - Family contacts: [name] (daughter) [phone]  - Is the Patient and Family aware and agreeable with plan? Yes  - Weekend Service and Pager: ICU on-call phone: [phone]  - OSH contacts: [place]: Nursing Supervisor [place]) ph: [phone]; fax: [phone]  - Primary Case Manager: Marjory Sneddon RN, [place] [phone]    What needs to be done:   1. Call ICU on-call to ask if pt still stable for transfer  2. Call OSH Nursing Supervisor to ask if ICU bed available (transfer referral packet was faxed on Friday, 6/11)  3. Schedule ambulance transport (ALS vs. CCT?) and complete PCS form  4. Order CD and print transfer packet   5. Bring transfer packet to [facility]

## 2019-11-26 ENCOUNTER — Inpatient Hospital Stay: Admit: 2019-11-26 | Discharge: 2019-11-26 | Payer: MEDICARE

## 2019-11-26 DIAGNOSIS — R69 Illness, unspecified: Secondary | ICD-10-CM

## 2019-11-26 DIAGNOSIS — Z9911 Dependence on respirator [ventilator] status: Secondary | ICD-10-CM

## 2019-11-26 DIAGNOSIS — R509 Fever, unspecified: Secondary | ICD-10-CM

## 2019-11-26 DIAGNOSIS — R0902 Hypoxemia: Secondary | ICD-10-CM

## 2019-11-26 DIAGNOSIS — J9811 Atelectasis: Secondary | ICD-10-CM

## 2019-11-26 LAB — ARTERIAL BLOOD GAS ONLY
Base excess: 3 mmol/L
Base excess: 3 mmol/L
Base excess: 5 mmol/L
Bicarbonate: 28 mmol/L (ref 23–29)
Bicarbonate: 28 mmol/L (ref 23–29)
Bicarbonate: 29 mmol/L (ref 23–29)
FIO2: 40 % (ref 20–100)
FIO2: 40 % (ref 20–100)
Oxygen Saturation: 93 % — ABNORMAL LOW (ref 95–100)
Oxygen Saturation: 94 % — ABNORMAL LOW (ref 95–100)
Oxygen Saturation: 98 % (ref 95–100)
PCO2: 48 mm Hg — ABNORMAL HIGH (ref 32–46)
PCO2: 49 mm Hg — ABNORMAL HIGH (ref 32–46)
PCO2: 42 mm Hg (ref 32–46)
PO2: 67 mm Hg — ABNORMAL LOW (ref 83–108)
PO2: 70 mm Hg — ABNORMAL LOW (ref 83–108)
PO2: 86 mm Hg (ref 83–108)
pH, Blood: 7.37 (ref 7.35–7.45)
pH, Blood: 7.37 (ref 7.35–7.45)
pH, Blood: 7.45 (ref 7.35–7.45)

## 2019-11-26 LAB — COMPLETE BLOOD COUNT
Hematocrit: 27 % — ABNORMAL LOW (ref 41.0–53.0)
Hematocrit: 26.4 % — ABNORMAL LOW (ref 41.0–53.0)
Hemoglobin: 8.1 g/dL — ABNORMAL LOW (ref 13.6–17.5)
Hemoglobin: 8 g/dL — ABNORMAL LOW (ref 13.6–17.5)
MCH: 28 pg (ref 26.0–34.0)
MCH: 28.3 pg (ref 26.0–34.0)
MCHC: 30 g/dL — ABNORMAL LOW (ref 31.0–36.0)
MCHC: 30.3 g/dL — ABNORMAL LOW (ref 31.0–36.0)
MCV: 93 fL (ref 80–100)
MCV: 93 fL (ref 80–100)
Platelet Count: 284 10*9/L (ref 140–450)
Platelet Count: 276 10*9/L (ref 140–450)
RBC Count: 2.89 10*12/L — ABNORMAL LOW (ref 4.40–5.90)
RBC Count: 2.83 10*12/L — ABNORMAL LOW (ref 4.40–5.90)
WBC Count: 15.4 10*9/L — ABNORMAL HIGH (ref 3.4–10.0)
WBC Count: 16.9 10*9/L — ABNORMAL HIGH (ref 3.4–10.0)

## 2019-11-26 LAB — BASIC METABOLIC PANEL (NA, K,
Anion Gap: 8 (ref 4–14)
Calcium, total, Serum / Plasma: 8.7 mg/dL (ref 8.4–10.5)
Carbon Dioxide, Total: 27 mmol/L (ref 22–29)
Chloride, Serum / Plasma: 99 mmol/L — ABNORMAL LOW (ref 101–110)
Creatinine: 5.69 mg/dL — ABNORMAL HIGH (ref 0.73–1.24)
Glucose, non-fasting: 101 mg/dL (ref 70–199)
Potassium, Serum / Plasma: 4.1 mmol/L (ref 3.5–5.0)
Sodium, Serum / Plasma: 134 mmol/L — ABNORMAL LOW (ref 135–145)
Urea Nitrogen, Serum / Plasma: 39 mg/dL — ABNORMAL HIGH (ref 7–25)
eGFR - high estimate: 11 mL/min — ABNORMAL LOW (ref >59)
eGFR - low estimate: 10 mL/min — ABNORMAL LOW (ref >59)

## 2019-11-26 LAB — POCT GLUCOSE
Glucose, iSTAT: 100 mg/dL (ref 70–199)
Glucose, iSTAT: 101 mg/dL (ref 70–199)
Glucose, iSTAT: 109 mg/dL (ref 70–199)
Glucose, iSTAT: 131 mg/dL (ref 70–199)
Glucose, iSTAT: 144 mg/dL (ref 70–199)
Glucose, iSTAT: 62 mg/dL — ABNORMAL LOW (ref 70–199)
Glucose, iSTAT: 91 mg/dL (ref 70–199)
Glucose, iSTAT: 94 mg/dL (ref 70–199)

## 2019-11-26 LAB — PHOSPHORUS, SERUM / PLASMA: Phosphorus, Serum / Plasma: 4.7 mg/dL (ref 2.3–4.7)

## 2019-11-26 LAB — TRIGLYCERIDES, SERUM: Triglycerides, serum: 88 mg/dL (ref <200)

## 2019-11-26 LAB — MAGNESIUM, SERUM / PLASMA: Magnesium, Serum / Plasma: 1.9 mg/dL (ref 1.6–2.6)

## 2019-11-26 MED ORDER — LIDOCAINE (PF) 10 MG/ML (1 %) INJECTION SOLUTION
10 | INTRAMUSCULAR | Status: DC | PRN
Start: 2019-11-26 — End: 2019-11-26

## 2019-11-26 MED ORDER — SODIUM CHLORIDE 0.9 % IV BOLUS
0.9 | INTRAVENOUS | Status: DC | PRN
Start: 2019-11-26 — End: 2019-11-26

## 2019-11-26 MED ORDER — SODIUM CHLORIDE 0.9 % DIALYSIS CIRCUIT
0.9 | INTRAVENOUS | Status: DC | PRN
Start: 2019-11-26 — End: 2019-11-26

## 2019-11-26 MED ORDER — SODIUM CHLORIDE 0.9 % DIALYSIS CIRCUIT
0.9 | Freq: Once | INTRAVENOUS | Status: AC
Start: 2019-11-26 — End: 2019-11-26
  Administered 2019-11-26: 16:00:00 200 mL via INTRAVENOUS

## 2019-11-26 MED ORDER — FENTANYL (PF) 50 MCG/ML INJECTION SOLUTION
50 mcg/mL | INTRAMUSCULAR | Status: DC | PRN
  Administered 2019-11-28 – 2019-12-01 (×9): 50 ug via INTRAVENOUS

## 2019-11-26 MED ORDER — FENTANYL (PF) 10 MCG/ML IN 0.9 % SODIUM CHLORIDE INTRAVENOUS
2500 | INTRAVENOUS | Status: DC
Start: 2019-11-26 — End: 2019-12-01
  Administered 2019-11-28 – 2019-11-30 (×2): 25 ug/h via INTRAVENOUS

## 2019-11-26 MED ORDER — LIDOCAINE 4 % TOPICAL CREAM
4 | Freq: Once | TOPICAL | Status: DC | PRN
Start: 2019-11-26 — End: 2019-11-26

## 2019-11-26 MED ORDER — SODIUM CHLORIDE 0.9 % INTRAVENOUS PIGGYBACK
0.9 % | INTRAVENOUS | Status: DC
  Administered 2019-11-27 – 2019-12-03 (×7): 1000 mg via INTRAVENOUS

## 2019-11-26 MED ORDER — SODIUM CHLORIDE 0.9 % DIALYSIS CIRCUIT
0.9 | Freq: Once | INTRAVENOUS | Status: AC
Start: 2019-11-26 — End: 2019-11-26
  Administered 2019-11-26: 19:00:00 200 mL via INTRAVENOUS

## 2019-11-26 MED FILL — VANCOMYCIN 500 MG INTRAVENOUS SOLUTION: 500 mg | INTRAVENOUS | Qty: 10

## 2019-11-26 MED FILL — DIPRIVAN 10 MG/ML INTRAVENOUS EMULSION: 10 mg/mL | INTRAVENOUS | Qty: 100

## 2019-11-26 MED FILL — NEPHRO-VITE 0.8 MG TABLET: 0.8 mg | ORAL | Qty: 1

## 2019-11-26 MED FILL — PHOSPHO-TRIN NEUTRAL 250 MG TABLET: 250 mg | ORAL | Qty: 2

## 2019-11-26 MED FILL — ATORVASTATIN 40 MG TABLET: 40 mg | ORAL | Qty: 1

## 2019-11-26 MED FILL — FENTANYL (PF) 50 MCG/ML INJECTION SOLUTION: 50 mcg/mL | INTRAMUSCULAR | Qty: 2

## 2019-11-26 MED FILL — HYDRALAZINE 20 MG/ML INJECTION SOLUTION: 20 mg/mL | INTRAMUSCULAR | Qty: 1

## 2019-11-26 MED FILL — ACETAMINOPHEN 500 MG TABLET: 500 mg | ORAL | Qty: 2

## 2019-11-26 MED FILL — XYLOCAINE-MPF 10 MG/ML (1 %) INJECTION SOLUTION: 10 mg/mL (1 %) | INTRAMUSCULAR | Qty: 2

## 2019-11-26 MED FILL — LANSOPRAZOLE 30 MG DELAYED RELEASE,DISINTEGRATING TABLET: 30 mg | ORAL | Qty: 1

## 2019-11-26 MED FILL — DEXTROSE 50 % IN WATER (D50W) INTRAVENOUS SYRINGE: INTRAVENOUS | Qty: 50

## 2019-11-26 MED FILL — MEROPENEM 500 MG INTRAVENOUS SOLUTION: 500 mg | INTRAVENOUS | Qty: 500

## 2019-11-26 NOTE — Nursing Note (Signed)
Pre Treatment Nursing Note  Pre treatment report received from [place], [place]  Pre treatment vitals: Wt 86 kg via Bed scale, BP 120/42, Pulse 89.  3 hours HD started in Pt Room via R-AVF QB 400 ml/min, ordered UF 1.5-2.5 L as tolerated. Normal saline prime.    Medication Administration:  Aranesp: 0 mcg IVP  Zemplar: 0 mcg IVP  Blood Transfusion: No   After HD MAR reviewed and After HD MAR: the following medications are due after HD: Vancomycin    Post Treatment Nursing Note  Post tx vitals: Unable to obtain bedside weight ICU staff at bedside and ICU team at bedside seeing patient , BP 152/51, Pulse 77.   Pt tolerated 3 hr treatment with net UF 2 L fluid removed. Post treatment Both needles pulled pressure helsd bleeding stopped small pressure dressing applied bleeding time=15 minutes. Bruit present & Thrill present. 0 lidocaine. Treatment orders were not adjusted during the run.   There were no adverse reactions/complications during this procedure. Post treatment report given to: Burman Nieves, RN

## 2019-11-26 NOTE — Progress Notes (Signed)
CRITICAL CARE PROGRESS NOTE - ATTENDING ONLY     11/26/2019    24 Hour Course  No dialysis / 24 hrs    Subjective  Intubated sedated    Vitals  (min & max of the last 24-hours or since hospital admission; most recent)  HR: Pulse  Min: 66  Max: 93; Heart Rate: 89 (11/26/19 1400)  ABP: Arterial Line BP (mmHg)   Min: 95/38  Max: 153/51; 138/48 (11/26/19 1400)  AMAP: Arterial Line MAP (mmHg)  Min: 56 mmHg  Max: 86 mmHg; 77 mmHg (11/26/19 1400)  NBP: BP  Min: 89/36  Max: 152/51; (!) 152/51 (11/26/19 1150)  NMAP: No data recorded; 71 mmHg (11/24/19 2000)  RR: Resp  Min: 10  Max: 21; 17 (11/26/19 1400)  SpO2: SpO2  Min: 91 %  Max: 98 %; 95 % (11/26/19 1400)  Temp: Temp  Min: 35.9 C (96.6 F)  Max: 36.6 C (97.9 F); 36.5 C (97.7 F) (11/26/19 1200)    Intake/Output  (from 0700 yesterday to 0700 today)  Net + 1sliter    Physical Exam, Data and Advanced Monitoring/Therapies  Neuro: intubated sedated no response to tactile stimulation  CV: RRR  Pulm: intubated.5/5 50% PSV  GI: soft Nontender   Renal/GU: net + no dialysis  Heme/Onc: hgb 80 stable  Plt 276  ID/Immuno/Rheum:WBC 16.9 afebrile   Endo: glu 90-150    Lines (site, type, placement date): RUE AVF  Left IJ introducer L radial a line  Activity in the last 24-hours (e.g.: ambulated, out of bed to chair, sat on edge of bed, bed-bound, etc.): bedrest    Labs        11/26/19  1259 11/26/19  0735 11/26/19  0406 11/26/19  0405 11/26/19  0009 11/25/19  1612 11/25/19  0952 11/25/19  0434 11/25/19  0431 11/24/19  2109 11/24/19  0410   PH37 7.45 7.37 7.37  --   --   --   --   --  7.43 7.43 7.40   PCO2 42 49* 48*  --   --   --   --   --  46 46 46   PO2 86 70* 67*  --   --   --   --   --  70* 73* 86   HCO3 _0 --   --   --   --   --  30* 31* 29   SAO2 98 94* 93*  --   --   --   --   --  95 96 97   NA  --   --   --  134*  --   --   --  137  --   --  136   K  --   --   --  4.1  --   --   --  3.9  --   --  4.3   CL  --   --   --  99*  --   --   --  101  --   --  101   CO2  --    --   --  27  --   --   --  27  --   --  25   BUN  --   --   --  39*  --   --   --  27*  --   --  45*   CREAT  --   --   --  5.69*  --   --   --  4.76*  --   --  6.81*   GLU  --   --   --  101  --   --   --  88  --   --  115   CA  --   --   --  8.7  --   --   --  8.6  --   --  8.9   MG  --   --   --  1.9  --   --   --  1.8  --   --  2.1   PO4  --   --   --  4.7  --   --   --  3.3  --   --  4.8*   TG  --   --   --  88  --   --   --   --   --   --  94   WBC  --   --   --  16.9* 15.4* 15.7* 14.7* 14.3*  --  13.5* 15.0*   HGB  --   --   --  8.0* 8.1* 8.0* 8.1* 8.1*  --  8.1* 8.1*   HCT  --   --   --  26.4* 27.0* 26.1* 25.8* 25.5*  --  25.6* 25.9*   PLT  --   --   --  276 284 275 279 280  --  265 239       Radiology Results   XR Chest 1 View (AP Portable)    Result Date: 11/26/2019  FINDINGS/IMPRESSION: Endotracheal tube and left internal jugular catheter are unchanged. Unchanged small lung volumes with retrocardiac opacity likely representing atelectasis. Clear upper lobes. No pneumothorax. No other change. Report dictated by: Denzil Hughes, MD, signed by: Denzil Hughes, MD Department of Radiology and Biomedical Imaging      Microbiology  Microbiology Results (last 24 hours)     Procedure Component Value Units Date/Time    Central Blood Culture [[id number]] Collected: 11/23/19 1657    Order Status: Completed Specimen: Central Blood Updated: 11/26/19 0030     Central Blood Culture No growth 3 days.    Peripheral Blood Culture [[id number]] Collected: 11/23/19 1711    Order Status: Completed Specimen: Peripheral Blood Updated: 11/26/19 0029     Peripheral Blood Culture No growth 3 days.          Other Studies  (e.g.: echo, cath, PFTs, etc.)  I have reviewed MRI and CT angio with NIR attending Dr Karene Fry and fellow    Scheduled Meds:   0.9% sodium chloride flush  3 mL Intravenous Q8H [place]    acetaminophen  1,000 mg Feeding Tube Q8H [place]    atorvastatin  40 mg Feeding Tube Q PM [place]    B-complex with vitamin C-Folic  Acid  1 tablet Per OG Tube Daily [place]    heparin flush  300 Units Intravenous Daily [place]    insulin aspart  0-40 Units Subcutaneous Q4H [place] (Insulin)    lansoprazole  30 mg Feeding Tube Q AM Before Breakfast [place]    meropenem  1,000 mg Intravenous Q PM [place]    vancomycin  500 mg Intravenous After Hemodialysis    white petrolatum-mineral oil  1 Application Both Eyes 4x Daily [place]     Continuous Infusions:   dextrose Stopped (11/25/19 1722)    fentaNYL      norEPINEPHrine Stopped (11/26/19  1105)    propofoL 30.0158 mcg/kg/min (11/26/19 1400)     PRN Meds:   0.9% sodium chloride flush  3 mL Intravenous PRN    dextrose  0-150 mL/hr Intravenous Continuous PRN    dextrose 50%  6.25 g Intravenous Q15 Min PRN    fentaNYL citrate  25-50 mcg Intravenous Q15 Min PRN    glucose  20 g Oral Q15 Min PRN    heparin flush  300 Units Intravenous PRN    hydrALAZINE  5 mg Intravenous Q2H PRN    labetalol  10 mg Intravenous Q2H PRN    lidocaine (PF)  5 mL Subcutaneous PRN       Code Status: DNAR/DNI      Assessment and Plan  William Sherman is a 65 y.o. 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   CT angio shows active extravasation from the right cervical internal carotid artery into right nasopharyngeal mass with large retropharyngeal hematoma extending from the skull base to the C3 level,.    Nasopharynx packed by [facility]. Sadly there are no therapeutic options that can be offered to treat the tumor given the extensive extent of the mass that has invaded the skull base (osteomyelitis) and right carotid artery (pseudoaneurysm). Given catistrophic nature of his illness and lack of therapeutic options Mr Olden family has decided to focus on comfort with hope of transferring patient closer to home. In the case of cardiopulmonary arrest and/or bleed he will not be resuscitated.          Plan By System  Neuro: unresponsive   - intubated for airway protection  - sedated for comfort and to prevent agitation and hypertension given risk of rebleeding from nasopharyngeal mass  - low dose fentanyl gtt and propofol infusions ( discontinuation of fentanyl gtt resulted in coughing- decision made to continue the infusion to minimize chance of coughing and associated nasopharyngeal bleeding)  - BP goals SBP < 140     CV:   -Normotensive  - Will manage BP with antihypertensives as needed  - may use low dose norepinephrine infusion to maintain BP if hypotensive  with goal to transfer to hospital close to home  - will not resuscitate in the case of acute deterioration.      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  Continue vancomycin and meropenem  - add caspofungin for worsening fever WBC    Endo: Insulin sliding scale    Vascular Access  Venous (rationale for line; place/maintain/remove): Left IJ introducer  Arterial (rationale for line; place/maintain/remove): left radial a line    Mobilization  PT/OT Consult: No  Activity (e.g.: goal, restriction, brace, etc.): bed rest    Prophylaxis  Non-pharmacologic prophylaxis against DVT/PE: scd  Pharmacologic prophylaxis against DVT/PE: N/A  Pharmacologic prophylaxis against stress ulcer: prevacid    Severity of Illness  Close monitoring from significant risk of clinical deterioration.  Need for mechanical ventilation.    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    Phylis Bougie, MD  Critical Care Medicine  Attending Physician  My date of service is 11/26/2019.  Total Time Spent with Patient: 30

## 2019-11-26 NOTE — Consults (Signed)
NEPHROLOGY (ATTENDING) DIALYSIS NOTE     Interim History/Events and Pertinent Physical Exam Elements  Plan to transfer back closer to home today  Continues to be intubated and sedated.  Intermittently on low dose levophed (off at the beginning of treatment)      Intake/Output Summary (Last 24 hours) at 11/26/2019 0859  Last data filed at 11/26/2019 0611  Gross per 24 hour   Intake 970.3 ml   Output    Net 970.3 ml       Most Recent Weight: 86 kg (189 lb 9.5 oz)  Admission Weight: (S) 84.4 kg (186 lb 1.1 oz)    Current IP Medications  Scheduled Meds:   sodium chloride for dialysis circuit  50-250 mL Intravenous Once in Dialysis    sodium chloride for dialysis circuit  20-250 mL Intravenous Once    0.9% sodium chloride flush  3 mL Intravenous Q8H [place]    acetaminophen  1,000 mg Feeding Tube Q8H [place]    atorvastatin  40 mg Feeding Tube Q PM [place]    B-complex with vitamin C-Folic Acid  1 tablet Per OG Tube Daily [place]    heparin flush  300 Units Intravenous Daily [place]    insulin aspart  0-40 Units Subcutaneous Q4H [place] (Insulin)    lansoprazole  30 mg Feeding Tube Q AM Before Breakfast [place]    meropenem  500 mg Intravenous Q PM [place]    vancomycin  500 mg Intravenous After Hemodialysis    white petrolatum-mineral oil  1 Application Both Eyes 4x Daily [place]     Continuous Infusions:   dextrose Stopped (11/25/19 1722)    fentaNYL 25 mcg/hr (11/26/19 0600)    norEPINEPHrine 0.02 mcg/kg/min (11/26/19 0730)    propofoL 20 mcg/kg/min (11/26/19 0729)     PRN Meds:   sodium chloride bolus  250 mL Intravenous Q2 Min PRN    sodium chloride bolus  250 mL Intravenous Q2 Min PRN    sodium chloride for dialysis circuit  100 mL Intravenous Q30 Min PRN    0.9% sodium chloride flush  3 mL Intravenous PRN    dextrose  0-150 mL/hr Intravenous Continuous PRN    dextrose 50%  6.25 g Intravenous Q15 Min PRN    fentaNYL citrate  25-50 mcg Intravenous Q1H PRN    glucose  20 g Oral Q15 Min PRN    heparin flush  300 Units Intravenous  PRN    hydrALAZINE  5 mg Intravenous Q2H PRN    labetalol  10 mg Intravenous Q2H PRN    lidocaine   Topical Once PRN    lidocaine   Topical Once PRN    lidocaine (PF)  0.2 mL Intradermal PRN    lidocaine (PF)  5 mL Subcutaneous PRN       Indication for Dialysis  End stage renal disease    Dialysis Prescription      Treatment Type HD Only    HD Duration (Hrs) 3    UF goal for HD (kg) 1-2    K+ 3 mEq/L    Ca++ 2.5 mEq/L    Na+ 138 mEq/L    Bicarb 35 mEq/L    Dialysate Flow 2 x Autoflow    Dialysate Temperature (C) 37    Na+ Model Program None    UF Profile for HD None    Access Type AVF    BFR-As tolerated to a maximum of: 400 mL/min    Dialyzer F160 (83 mL)    Machine  Lines Adult (108 mL)    Crit-line RN Discretion    Goal BP type SBP    Goal SBP> 90          I have personally examined the patient on Hemodialysis    Assessment and Recommendations  Carlosdaniel Grob is a 64 y.o. male [facility] HD TTS via AVF, diabetes, prior stroke,w/ right nasopharyngeal tumor who presents after high volume epistaxis with hemoglobin drop and requiring intubation for airway protection.    Continue TTS schedule while admitted.  We will continue to administer Aranesp 168mg weekly on dialysis (last 6/8)    CBurgess Estelle MD  Nephrology [facility] ([phone]  11/26/19    -----------------------------------------------------------------------------------------  Please see below for the appropriate Nephrology consult pager to reach [place]  Location & Service Pager (all hours)   [facility] and established consults [place]-  all new and established consults [phone]  [place] -  all new consults [phone]  [facility] - established consults [phone]  [place] [facility] - established consults [phone]  [place] [phone]

## 2019-11-26 NOTE — Consults (Signed)
INFECTIOUS DISEASE CLINICAL UPDATE NOTE    Spiros Greenfeld is a 64 y.o. 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     Pt is planned to transfer to hospital closer to home. 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 If continued on therapy, would recommend follow up MRI at end of therapy to determine if need for ongoing antimicrobials.  Of note, treatment with antibiotics alone in this setting will provide very minimal benefit. Given skull-based osteomyelitis, we have considered need for fungal coverage (aspergillous, mucor), but at this time we prefer to hold off on starting amphotericin B due to lack of culture/path data to necessitate antifungal treatment at this time.    --Continue IV vancomycin post-HD, dosing per pharmacy for goal 15-20  --Continue meropenem 1g IV q24hr, renally dosed for iHD  --Plan for 6 weeks of vancomycin + meropenem: 6/10 - 7/22. While on therapy needs weekly CBC w diff, BMP, vanc trough. Monitor for cytopenias, eosinophilia, rash  --If within [place] and completes 6 weeks of IV abx therapy, obtain MRI brain/face w and w/o contrast at end of therapy ~7/22 to determine need for ongoing treatment    We will sign-off. Please page General ID Consult pager [phone] with questions.

## 2019-11-26 NOTE — Interdisciplinary (Addendum)
Confirmed with [address]: Nursing Supervisor [place]) ph: [phone] that there are currently no ICU beds for this transfer.    Jenny Reichmann confirms she is aware of this patient needing an ICU bed and has all the information she needs at this time.    Updated BS RN.     Corday Wyka RNCCM-Neurosurgery CCM    This weekend covering Cm following this case/service, today, Sat Only

## 2019-11-27 LAB — PHOSPHORUS, SERUM / PLASMA: Phosphorus, Serum / Plasma: 3 mg/dL (ref 2.3–4.7)

## 2019-11-27 LAB — COMPLETE BLOOD COUNT
Hematocrit: 25.3 % — ABNORMAL LOW (ref 41.0–53.0)
Hemoglobin: 7.8 g/dL — ABNORMAL LOW (ref 13.6–17.5)
MCH: 28.6 pg (ref 26.0–34.0)
MCHC: 30.8 g/dL — ABNORMAL LOW (ref 31.0–36.0)
MCV: 93 fL (ref 80–100)
Platelet Count: 304 10*9/L (ref 140–450)
RBC Count: 2.73 10*12/L — ABNORMAL LOW (ref 4.40–5.90)
WBC Count: 14 10*9/L — ABNORMAL HIGH (ref 3.4–10.0)

## 2019-11-27 LAB — POCT GLUCOSE
Glucose, iSTAT: 119 mg/dL (ref 70–199)
Glucose, iSTAT: 155 mg/dL (ref 70–199)
Glucose, iSTAT: 163 mg/dL (ref 70–199)
Glucose, iSTAT: 191 mg/dL (ref 70–199)
Glucose, iSTAT: 194 mg/dL (ref 70–199)
Glucose, iSTAT: 200 mg/dL — ABNORMAL HIGH (ref 70–199)

## 2019-11-27 LAB — BASIC METABOLIC PANEL (NA, K,
Anion Gap: 9 (ref 4–14)
Calcium, total, Serum / Plasma: 8.9 mg/dL (ref 8.4–10.5)
Carbon Dioxide, Total: 26 mmol/L (ref 22–29)
Chloride, Serum / Plasma: 100 mmol/L — ABNORMAL LOW (ref 101–110)
Creatinine: 4.14 mg/dL — ABNORMAL HIGH (ref 0.73–1.24)
Glucose, non-fasting: 168 mg/dL (ref 70–199)
Potassium, Serum / Plasma: 3.4 mmol/L — ABNORMAL LOW (ref 3.5–5.0)
Sodium, Serum / Plasma: 135 mmol/L (ref 135–145)
Urea Nitrogen, Serum / Plasma: 28 mg/dL — ABNORMAL HIGH (ref 7–25)
eGFR - high estimate: 17 mL/min — ABNORMAL LOW (ref >59)
eGFR - low estimate: 15 mL/min — ABNORMAL LOW (ref >59)

## 2019-11-27 LAB — MAGNESIUM, SERUM / PLASMA: Magnesium, Serum / Plasma: 2.1 mg/dL (ref 1.6–2.6)

## 2019-11-27 LAB — VANCOMYCIN LEVEL: Vancomycin: 17 mg/L

## 2019-11-27 MED ORDER — VANCOMYCIN 500 MG INTRAVENOUS SOLUTION
500 mg | INTRAVENOUS | Status: DC
  Administered 2019-11-29 – 2019-12-01 (×2): 500 mg via INTRAVENOUS

## 2019-11-27 MED FILL — ACETAMINOPHEN 500 MG TABLET: 500 mg | ORAL | Qty: 2

## 2019-11-27 MED FILL — HYDRALAZINE 20 MG/ML INJECTION SOLUTION: 20 mg/mL | INTRAMUSCULAR | Qty: 1

## 2019-11-27 MED FILL — DIPRIVAN 10 MG/ML INTRAVENOUS EMULSION: 10 mg/mL | INTRAVENOUS | Qty: 100

## 2019-11-27 MED FILL — LABETALOL 5 MG/ML INTRAVENOUS SOLUTION: 5 mg/mL | INTRAVENOUS | Qty: 20

## 2019-11-27 MED FILL — NOREPINEPHRINE BITARTRATE 4 MG/250 ML (16 MCG/ML) IN 0.9 % NACL IV: 4 mg/250 mL (16 mcg/mL) | INTRAVENOUS | Qty: 250

## 2019-11-27 MED FILL — MEROPENEM 1 GRAM INTRAVENOUS SOLUTION: 1 gram | INTRAVENOUS | Qty: 1000

## 2019-11-27 MED FILL — NEPHRO-VITE 0.8 MG TABLET: 0.8 mg | ORAL | Qty: 1

## 2019-11-27 MED FILL — LANSOPRAZOLE 30 MG DELAYED RELEASE,DISINTEGRATING TABLET: 30 mg | ORAL | Qty: 1

## 2019-11-27 MED FILL — ATORVASTATIN 40 MG TABLET: 40 mg | ORAL | Qty: 1

## 2019-11-27 NOTE — Progress Notes (Signed)
CRITICAL CARE PROGRESS NOTE - ATTENDING ONLY     11/26/2019    24 Hour Course  dialyzed 2.4 liters    Subjective  Intubated sedated    Vitals  (min & max of the last 24-hours or since hospital admission; most recent)  HR: Pulse  Min: 70  Max: 93; Heart Rate: 72 (11/27/19 1344)  ABP: Arterial Line BP (mmHg)   Min: 103/37  Max: 158/54; 123/43 (11/27/19 1300)  AMAP: Arterial Line MAP (mmHg)  Min: 58 mmHg  Max: 88 mmHg; 68 mmHg (11/27/19 1300)  NBP: No data recorded; (!) 152/51 (11/26/19 1150)  NMAP: No data recorded; 71 mmHg (11/24/19 2000)  RR: Resp  Min: 10  Max: 18; 13 (11/27/19 1344)  SpO2: SpO2  Min: 95 %  Max: 99 %; 96 % (11/27/19 1344)  Temp: Temp  Min: 36.5 C (97.7 F)  Max: 37.4 C (99.3 F); 36.5 C (97.7 F) (11/27/19 0800)    Intake/Output  (from 0700 yesterday to 0700 today)  Net - 530 cc    Physical Exam, Data and Advanced Monitoring/Therapies  Neuro: intubated sedated no response to tactile stimulation  CV: RRR  Pulm: intubated.5/5 50% PSV  GI: soft Nontender   Renal/GU: net + no dialysis  Heme/Onc: hgb 7.8 stable  Plt 304  ID/Immuno/Rheum:WBC 14 afebrile   Endo: glu 160-200    Lines (site, type, placement date): RUE AVF  Left IJ introducer L radial a line  Activity in the last 24-hours (e.g.: ambulated, out of bed to chair, sat on edge of bed, bed-bound, etc.): bedrest    Labs        11/27/19  0233 11/26/19  1259 11/26/19  0735 11/26/19  0406 11/26/19  0405 11/26/19  0009 11/25/19  1612 11/25/19  0952 11/25/19  0434 11/25/19  0431 11/24/19  2109   PH37  --  7.45 7.37 7.37  --   --   --   --   --  7.43 7.43   PCO2  --  42 49* 48*  --   --   --   --   --  46 46   PO2  --  86 70* 67*  --   --   --   --   --  70* 73*   HCO3  --  _0 --   --   --   --   --  30* 31*   SAO2  --  98 94* 93*  --   --   --   --   --  95 96   NA 135  --   --   --  134*  --   --   --  137  --   --    K 3.4*  --   --   --  4.1  --   --   --  3.9  --   --    CL 100*  --   --   --  99*  --   --   --  101  --   --    CO2 26  --    --   --  27  --   --   --  27  --   --    BUN 28*  --   --   --  39*  --   --   --  27*  --   --    CREAT 4.14*  --   --   --  5.69*  --   --   --  4.76*  --   --    GLU 168  --   --   --  101  --   --   --  88  --   --    CA 8.9  --   --   --  8.7  --   --   --  8.6  --   --    MG 2.1  --   --   --  1.9  --   --   --  1.8  --   --    PO4 3.0  --   --   --  4.7  --   --   --  3.3  --   --    TG  --   --   --   --  88  --   --   --   --   --   --    WBC 14.0*  --   --   --  16.9* 15.4* 15.7* 14.7* 14.3*  --  13.5*   HGB 7.8*  --   --   --  8.0* 8.1* 8.0* 8.1* 8.1*  --  8.1*   HCT 25.3*  --   --   --  26.4* 27.0* 26.1* 25.8* 25.5*  --  25.6*   PLT 304  --   --   --  276 284 275 279 280  --  265       Radiology Results  No results found.    Microbiology  Microbiology Results (last 24 hours)     Procedure Component Value Units Date/Time    Central Blood Culture [[id number]] Collected: 11/23/19 1657    Order Status: Completed Specimen: Central Blood Updated: 11/27/19 0213     Central Blood Culture No growth 4 days.    Peripheral Blood Culture [[id number]] Collected: 11/23/19 1711    Order Status: Completed Specimen: Peripheral Blood Updated: 11/27/19 6333     Peripheral Blood Culture No growth 4 days.          Other Studies  (e.g.: echo, cath, PFTs, etc.)  I have reviewed MRI and CT angio with NIR attending Dr William Sherman and fellow    Scheduled Meds:   0.9% sodium chloride flush  3 mL Intravenous Q8H [place]    acetaminophen  1,000 mg Feeding Tube Q8H [place]    atorvastatin  40 mg Feeding Tube Q PM [place]    B-complex with vitamin C-Folic Acid  1 tablet Per OG Tube Daily [place]    heparin flush  300 Units Intravenous Daily [place]    insulin aspart  0-40 Units Subcutaneous Q4H [place] (Insulin)    lansoprazole  30 mg Feeding Tube Q AM Before Breakfast [place]    meropenem  1,000 mg Intravenous Q PM [place]    vancomycin  500 mg Intravenous After Hemodialysis    white petrolatum-mineral oil  1 Application Both Eyes 4x Daily [place]     Continuous  Infusions:   dextrose Stopped (11/25/19 1722)    fentaNYL 25 mcg/hr (11/27/19 1300)    norEPINEPHrine Stopped (11/26/19 1105)    propofoL 30.0158 mcg/kg/min (11/27/19 1300)     PRN Meds:   0.9% sodium chloride flush  3 mL Intravenous PRN    dextrose  0-150 mL/hr Intravenous Continuous PRN    dextrose 50%  6.25 g Intravenous Q15 Min PRN    fentaNYL citrate  25-50 mcg Intravenous  Q15 Min PRN    glucose  20 g Oral Q15 Min PRN    heparin flush  300 Units Intravenous PRN    hydrALAZINE  5 mg Intravenous Q2H PRN    labetalol  10 mg Intravenous Q2H PRN    lidocaine (PF)  5 mL Subcutaneous PRN       Code Status: DNAR/DNI      Assessment and Plan  William Sherman is a 64 y.o. 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   CT angio shows active extravasation from the right cervical internal carotid artery into right nasopharyngeal mass with large retropharyngeal hematoma extending from the skull base to the C3 level,.    Nasopharynx packed by [facility]. Sadly there are no therapeutic options that can be offered to treat the tumor given the extensive extent of the mass that has invaded the skull base (osteomyelitis) and right carotid artery (pseudoaneurysm). Given catistrophic nature of his illness and lack of therapeutic options Mr William Sherman family has decided to focus on comfort with hope of transferring patient closer to home. In the case of cardiopulmonary arrest and/or bleed he will not be resuscitated.    Will update family today. Will discuss possible transition to comfort care here at [facility] if a transfercannot be arranged. Will work with his  family so that they can be with him.        Plan By System  Neuro: unresponsive   - intubated for airway protection  - sedated for comfort and to prevent agitation and hypertension given risk of rebleeding from nasopharyngeal  mass  - low dose fentanyl gtt and propofol infusions ( discontinuation of fentanyl gtt resulted in coughing- decision made to continue the infusion to minimize chance of coughing and associated nasopharyngeal bleeding)  - BP goals SBP < 140     CV:   -Normotensive  - Will manage BP with antihypertensives as needed  - may use low dose norepinephrine infusion to maintain BP if needed to safely transfer to hospital close to home  - will not resuscitate in the case of acute deterioration.      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  Need for mechanical ventilation.    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    Phylis Bougie, MD  Critical Care  Medicine Attending Physician  My date of service is 11/27/2019.  Total Time Spent with Patient: 30

## 2019-11-27 NOTE — Consults (Signed)
[place]  Vancomycin Per Pharmacy Level Assessment Note    Subjective:    William Sherman is a 64 y.o. male on day 5 of vancomycin therapy for the treatment of head and neck infection with a goal trough of 15-20 mg/L.      Objective:    Estimated Creatinine Clearance: 19.9 mL/min (A) (based on SCr of 4.14 mg/dL (H)).    Temp (24hrs), Avg:36.6 C (97.8 F), Min:35.9 C (96.6 F), Max:37.4 C (99.3 F)    WBC Count   Date/Time Value Ref Range Status   11/27/2019 02:33 AM 14.0 (H) 3.4 - 10.0 x10E9/L Final     Vancomycin   Date/Time Value Ref Range Status   11/27/2019 02:33 AM 17 mg/L Final     Comment:     Therapeutic trough 10-20 mg/L. For Patients with serious MRSA infections (central nervous system infections, endocarditis, ventilator-associated pneumonia, bacteremia or osteomyelitis) trough levels of 15-20 mg/L are recommended. ID Consult is   recommended.         Assessment/Plan:    The patient has a therapeutic vancomycin level with current dosing regimen of 500 mg IV after dialysis. This vancomycin level is appropriate relative to the last dose. The patients renal function is stable based on the patients serum creatinine and urine output (iHD). Plan to continue with vancomycin 500 mg IV post-HD. No vancomycin level has been ordered at this time.    Pharmacy will continue to monitor the patient for appropriateness of therapy. Thank you for involving [place] in this patients care.    Jenita Seashore, Pharm.D.  Inpatient Clinical Pharmacist

## 2019-11-27 NOTE — Interdisciplinary (Signed)
Weekend CM Note:     Spoke with Nursing Supervisor [facility]) ph: [phone] - they're holding patients in the ED and have no beds at any level today.     Lyndal Pulley, RN CM 11/27/19 8:27 AM-covering Sunday only

## 2019-11-27 NOTE — Consults (Signed)
NEPHROLOGY FOLLOW UP CONSULT NOTE     Events  HD yesterday 2kg removed    Subjective  Intubated/sedated    Review of Systems    Review of systems unobtainable due to intubated    Vitals  Temp:  [36.5 C (97.7 F)-37.4 C (99.3 F)] 36.5 C (97.7 F)  Heart Rate:  [68-93] 72  *Resp:  [10-18] 14  Arterial Line BP (mmHg) : (109-158)/(38-54) 147/50  FiO2 (%):  [30 %-50 %] 30 %  SpO2:  [94 %-99 %] 97 %    Most Recent Weight: 85 kg (187 lb 6.3 oz)  Admission Weight: (S) 84.4 kg (186 lb 1.1 oz)      Intake/Output Summary (Last 24 hours) at 11/27/2019 1914  Last data filed at 11/27/2019 1800  Gross per 24 hour   Intake 1062.85 ml   Output    Net 1062.85 ml       Current IP Medications  Scheduled Meds:   0.9% sodium chloride flush  3 mL Intravenous Q8H [place]    acetaminophen  1,000 mg Feeding Tube Q8H [place]    atorvastatin  40 mg Feeding Tube Q PM [place]    B-complex with vitamin C-Folic Acid  1 tablet Per OG Tube Daily [place]    heparin flush  300 Units Intravenous Daily [place]    insulin aspart  0-40 Units Subcutaneous Q4H [place] (Insulin)    lansoprazole  30 mg Feeding Tube Q AM Before Breakfast [place]    meropenem  1,000 mg Intravenous Q PM [place]    vancomycin  500 mg Intravenous After Hemodialysis    white petrolatum-mineral oil  1 Application Both Eyes 4x Daily [place]     Continuous Infusions:   dextrose Stopped (11/25/19 1722)    fentaNYL 25 mcg/hr (11/27/19 1901)    norEPINEPHrine Stopped (11/26/19 1105)    propofoL 40 mcg/kg/min (11/27/19 1815)     PRN Meds:   0.9% sodium chloride flush  3 mL Intravenous PRN    dextrose  0-150 mL/hr Intravenous Continuous PRN    dextrose 50%  6.25 g Intravenous Q15 Min PRN    fentaNYL citrate  25-50 mcg Intravenous Q15 Min PRN    glucose  20 g Oral Q15 Min PRN    heparin flush  300 Units Intravenous PRN    hydrALAZINE  5 mg Intravenous Q2H PRN    labetalol  10 mg Intravenous Q2H PRN    lidocaine (PF)  5 mL Subcutaneous PRN       Physical Exam  General: critically ill, sedated  Eyes:  normal conjunctivae, anicteric sclerae  ENMT: normal external nose and ears; mucous membranes moist; ETT in place  Respiratory: coarse upper airway sounds  Cardiovascular: regular rate & rhythm; normal s1/s2; no rub  Gasterointestional: soft abdomen, non-tender, non-distended, positive active bowel sounds  Musculoskeletal: no clubbing/cyanosis, 2+ edema  Skin/Integumentary: no obvious rash  Neurologic: sedated  Psychiatric: sedated  Heme/lymphatic: no excessive bruising or bleeding  Dialysis access: RAVF    Data  All labs available in last 3 days in [facility] reviewed.          11/27/19  0233 11/26/19  1259 11/26/19  0735 11/26/19  0406 11/26/19  0405 11/26/19  0009 11/25/19  0434   WBC 14.0*  --   --   --  16.9* 15.4* 14.3*   HCT 25.3*  --   --   --  26.4* 27.0* 25.5*   PLT 304  --   --   --  276 284 280   NA 135  --   --   --  134*  --  137   K 3.4*  --   --   --  4.1  --  3.9   CL 100*  --   --   --  99*  --  101   CO2 26  --   --   --  27  --  27   BUN 28*  --   --   --  39*  --  27*   CREAT 4.14*  --   --   --  5.69*  --  4.76*   GLU 168  --   --   --  101  --  88   CA 8.9  --   --   --  8.7  --  8.6   MG 2.1  --   --   --  1.9  --  1.8   PO4 3.0  --   --   --  4.7  --  3.3   PH37  --  7.45 7.37 7.37  --   --   --    PCO2  --  42 49* 48*  --   --   --    PO2  --  86 70* 67*  --   --   --            Assessment and Recommendations  William Sherman is a 64 y.o. male ESKDon HD TTS via AVF, diabetes, prior stroke,w/ right nasopharyngeal tumor who presents after high volume epistaxis with hemoglobin drop and requiring intubation for airway protection    #ESKD  -Plan HD TTS while admitted    #HTN  Well controlled off anti-HTN  -Continue to hold home anti-htn    #Volume  Euvolemic on exam  -Will aim to keep net even    #Hyperkalemia  Potassium well controlled with dialysis  -Low potassium diet if taking PO/tube feeds    #Anemia  Hemoglobin 7. ON 15K units of EPO/week as an outpatient which ~ 22mg darbepoetin.  -S/p  1076m darbepoetin 6/8, due for repeat 6/15 which we will order    #Mineral Bone Disease  Calcium and phos normal. On hecterol 4.2m18mqHD~zemplar 68m73m -Will give activated vitamin D on dialysis  - Will follow phos levels\    [name]  Nephrology [facility] Service ([phone]/13/21    -----------------------------------------------------------------------------------------  Please see below for the appropriate Nephrology consult pager to reach us: [place] & Service Pager (all hours)   [facility] new and established consults [facility] new and established consults [phone] -  all new consults [phone] - established consults [phone] [facility] Service - established consults [facility]-[place] - new and established [phone]

## 2019-11-28 ENCOUNTER — Inpatient Hospital Stay: Admit: 2019-11-28 | Discharge: 2019-11-28 | Payer: MEDICARE

## 2019-11-28 DIAGNOSIS — I607 Nontraumatic subarachnoid hemorrhage from unspecified intracranial artery: Secondary | ICD-10-CM

## 2019-11-28 DIAGNOSIS — Z4659 Encounter for fitting and adjustment of other gastrointestinal appliance and device: Secondary | ICD-10-CM

## 2019-11-28 DIAGNOSIS — C7989 Secondary malignant neoplasm of other specified sites: Secondary | ICD-10-CM

## 2019-11-28 DIAGNOSIS — M869 Osteomyelitis, unspecified: Secondary | ICD-10-CM

## 2019-11-28 DIAGNOSIS — C7951 Secondary malignant neoplasm of bone: Secondary | ICD-10-CM

## 2019-11-28 DIAGNOSIS — C119 Malignant neoplasm of nasopharynx, unspecified: Secondary | ICD-10-CM

## 2019-11-28 LAB — POCT GLUCOSE
Glucose, iSTAT: 142 mg/dL (ref 70–199)
Glucose, iSTAT: 173 mg/dL (ref 70–199)
Glucose, iSTAT: 134 mg/dL (ref 70–199)
Glucose, iSTAT: 116 mg/dL (ref 70–199)
Glucose, iSTAT: 116 mg/dL (ref 70–199)
Glucose, iSTAT: 179 mg/dL (ref 70–199)

## 2019-11-28 LAB — BASIC METABOLIC PANEL (NA, K,
Anion Gap: 11 (ref 4–14)
Calcium, total, Serum / Plasma: 9.4 mg/dL (ref 8.4–10.5)
Carbon Dioxide, Total: 24 mmol/L (ref 22–29)
Chloride, Serum / Plasma: 100 mmol/L — ABNORMAL LOW (ref 101–110)
Creatinine: 5 mg/dL — ABNORMAL HIGH (ref 0.73–1.24)
Glucose, non-fasting: 118 mg/dL (ref 70–199)
Potassium, Serum / Plasma: 3.4 mmol/L — ABNORMAL LOW (ref 3.5–5.0)
Sodium, Serum / Plasma: 135 mmol/L (ref 135–145)
Urea Nitrogen, Serum / Plasma: 40 mg/dL — ABNORMAL HIGH (ref 7–25)
eGFR - high estimate: 13 mL/min — ABNORMAL LOW (ref >59)
eGFR - low estimate: 12 mL/min — ABNORMAL LOW (ref >59)

## 2019-11-28 LAB — TRIGLYCERIDES, SERUM: Triglycerides, serum: 65 mg/dL (ref <200)

## 2019-11-28 LAB — PHOSPHORUS, SERUM / PLASMA: Phosphorus, Serum / Plasma: 3.9 mg/dL (ref 2.3–4.7)

## 2019-11-28 LAB — MAGNESIUM, SERUM / PLASMA: Magnesium, Serum / Plasma: 2.2 mg/dL (ref 1.6–2.6)

## 2019-11-28 MED ORDER — AMLODIPINE 5 MG TABLET
5 mg | ORAL | Status: DC
  Administered 2019-11-28 – 2019-11-29 (×2): 5 mg via ORAL

## 2019-11-28 MED FILL — NEPHRO-VITE 0.8 MG TABLET: 0.8 mg | ORAL | Qty: 1

## 2019-11-28 MED FILL — AMLODIPINE 5 MG TABLET: 5 mg | ORAL | Qty: 1

## 2019-11-28 MED FILL — ACETAMINOPHEN 500 MG TABLET: 500 mg | ORAL | Qty: 2

## 2019-11-28 MED FILL — LANSOPRAZOLE 30 MG DELAYED RELEASE,DISINTEGRATING TABLET: 30 mg | ORAL | Qty: 1

## 2019-11-28 MED FILL — HYDRALAZINE 20 MG/ML INJECTION SOLUTION: 20 mg/mL | INTRAMUSCULAR | Qty: 1

## 2019-11-28 MED FILL — ATORVASTATIN 40 MG TABLET: 40 mg | ORAL | Qty: 1

## 2019-11-28 MED FILL — FENTANYL (PF) 10 MCG/ML IN 0.9 % SODIUM CHLORIDE INTRAVENOUS: 2500 mcg/250 mL (10 mcg/mL) | INTRAVENOUS | Qty: 250

## 2019-11-28 MED FILL — MEROPENEM 1 GRAM INTRAVENOUS SOLUTION: 1 gram | INTRAVENOUS | Qty: 1000

## 2019-11-28 MED FILL — DIPRIVAN 10 MG/ML INTRAVENOUS EMULSION: 10 mg/mL | INTRAVENOUS | Qty: 100

## 2019-11-28 NOTE — Progress Notes (Signed)
This is a shared service.     OTOLARYNGOLOGY - HEAD AND NECK SURGERY  History and Physical/Consult Note    Chief Complaint: high volume epistaxis    Requesting Provider: Donivan Scull MD    Requesting Service: ICU    History of Present Illness: William Sherman is a 64 y.o. 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    Review of Systems:  Unable to obtain complete ROS due to patient's acuity of condition.     --- For other relevant health data, including Allergies, Home medications, and Health history, please see addendum of note ---    Objective:  BP (!) 152/51   Pulse 62   Temp (!) 1 C (33.8 F)   Resp (!) 10   Ht 172.7 cm (_0 )   Wt 87 kg (191 lb 12.8 oz)   SpO2 94%   BMI 29.16 kg/m      Vent settings:  Mode: CMV (06/14 0920)  RR Set: 10 (06/14 0920)  Flow Rate Set (lpm): 36 (06/14 0920)  Tidal Volume Set: 450 mL (06/14 0920)  PEEP/CPAP Set (cm H2O): 8 cm H2O (06/14 0920)  FiO2 (%): 30 % (06/14 1100)        Intake/Output Summary (Last 24 hours) at 11/28/2019 1116  Last data filed at 11/28/2019 1100  Gross per 24 hour   Intake 1292.67 ml   Output    Net 1292.67 ml       Physical Exam:  General: Ill appearing man, intubated  Neurological:  sedated   Cardio/Pulmonary: mechanically ventilated  HEENT:      Head/Face: NCAT, no worrisome face lesions     Eyes: left pupil and iris opaque, right pupil and iris dark brown     Ears: External ears normal-appearing     Nose: External nose normal-appearing, packed bilaterally with rhinorockets     Oral cavity: 8.0 ETT in place      Neck: thick wide neck, supple, flat, trachea midline, no cervical or supraclavicular lymphadenopathy      Neuro: sedated    Selected Results:  I have  reviewed the following laboratory data --        11/27/19  0233 11/26/19  0405 11/26/19  0009   WBC 14.0* 16.9* 15.4*   HCT 25.3* 26.4* 27.0*   PLT 304 276 284           11/28/19  0409 11/27/19  0233 11/26/19  0405   NA 135 135 134*   K 3.4* 3.4* 4.1   CL 100* 100* 99*   CO2 _1 CREAT 5.00* 4.14* 5.69*   GLU 118 168 101   MG 2.2 2.1 1.9   PO4 3.9 3.0 4.7       Lab Results   Component Value Date    WBC Count 14.0 (H) 11/27/2019    Hemoglobin 7.8 (L) 11/27/2019    Hematocrit 25.3 (L) 11/27/2019    MCV 93 11/27/2019    Platelet Count 304 11/27/2019     Lab Results   Component Value Date    NA 135 11/28/2019    K 3.4 (L) 11/28/2019    CL 100 (L) 11/28/2019    CO2 24 11/28/2019  BUN 40 (H) 11/28/2019    CREAT 5.00 (H) 11/28/2019    GLU 118 11/28/2019     Lab Results   Component Value Date    Calcium, total, Serum / Plasma 9.4 11/28/2019    Phosphorus, Serum / Plasma 3.9 11/28/2019     Lab Results   Component Value Date    Magnesium, Serum / Plasma 2.2 11/28/2019     Lab Results   Component Value Date    Alanine transaminase 10 11/22/2019    AST 12 11/22/2019    Alkaline Phosphatase 50 11/22/2019    Bilirubin, Total 0.8 11/22/2019     Lab Results   Component Value Date    Int'l Normaliz Ratio 1.5 (H) 11/23/2019    PT 17.4 (H) 11/23/2019     Lab Results   Component Value Date    Activated Partial Thromboplastin Time 36.0 (H) 11/23/2019     No results found for: CK, MBMU, TRPI, TRIPZ  No results found for: POCTLEUK, POCTNITRITE, POCTPROTEIN, POCTPHUR, POCTRBCUR, POCTSPECGRAV, POCTKETONESU, POCTBILIUR, POCTGLUCUR      Review of Other Relevant Data:  Radiology Results  No results found.    The plan regarding Cayleb Jarnigan was discussed with [facility]ending Sur[facility], Drs. Caron Presume and Christeen Douglas.    Assessment and Plan:  [name] ([id number]) - 1356/1356-12  64 y.o. 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 Per discussion with OSH [facility] doc/ID revealed that the patient likely developed a severe ear infection with worsening and invasion in the skull base and surrounding structures. Pathogens that can cause this extensive disease include Staph aureus, Pseudomonas, and Fungal infections (aspergillous, mucor). Biopsy and resection of this area is not recommended by [facility] at this time given high morbidity and mortality associated with this intervention. Given all this information family would like to pursue focus on comfort care for Mr. [name]. Code status changed to DNR. Will continue intubation and supportive care for now with hopes of being able to transfer him to a facility closer to family for comfort care.    Recommend  - appreciate excellent global care per primary team  -pending transfer back to OSH closer to home  - [facility] will continue to follow while in house. Please alert [facility] to any acute changes in clinical status.       11/28/19    Code Status:   Full Code    --- Please page/call the Otolaryngology - Head & Neck Surgery Consult service pager with any questions ---      Additional Medical History    Medications Prior to Admission:  Scheduled Meds:   0.9% sodium chloride flush  3 mL Intravenous Q8H [place]    acetaminophen  1,000 mg Feeding Tube Q8H [place]    atorvastatin  40 mg Feeding Tube Q PM [place]    B-complex with vitamin C-Folic Acid  1 tablet Per OG Tube Daily [place]    heparin flush  300 Units Intravenous Daily [place]    insulin aspart  0-40 Units Subcutaneous Q4H [facility] (Insulin)    lansoprazole  30 mg Feeding Tube Q AM Before  Breakfast [place]    meropenem  1,000 mg Intravenous Q PM [place]    vancomycin  500 mg Intravenous After Hemodialysis    white petrolatum-mineral oil  1 Application Both Eyes 4x Daily [place]     Continuous Infusions:   dextrose 50 mL/hr (11/28/19 0918)    fentaNYL 25 mcg/hr (11/28/19 1100)    norEPINEPHrine Stopped (11/26/19 1105)    propofoL 40.0869 mcg/kg/min (11/28/19 1100)     PRN Meds:   0.9% sodium chloride flush  3 mL Intravenous PRN    dextrose  0-150 mL/hr Intravenous Continuous PRN    dextrose 50%  6.25 g Intravenous Q15 Min PRN    fentaNYL citrate  25-50 mcg Intravenous Q15 Min PRN    glucose  20 g Oral Q15 Min PRN    heparin flush  300 Units Intravenous PRN    hydrALAZINE  5 mg Intravenous Q2H PRN    labetalol  10 mg Intravenous Q2H PRN    lidocaine (PF)  5 mL Subcutaneous PRN       Allergies:  Allergies/Contraindications  No Known Allergies    Past Medical History:  No past medical history on file.    Past Surgical History:  No past surgical history on file.    Social History:  Social History     Socioeconomic History    Marital status: Married     Spouse name: Not on file    Number of children: Not on file    Years of education: Not on file    Highest education level: Not on file   Occupational History    Not on file   Tobacco Use    Smoking status: Not on file   Substance and Sexual Activity    Alcohol use: Not on file    Drug use: Not on file    Sexual activity: Not on file   Other Topics Concern    Not on file   Social History Narrative    Not on file     Social Determinants of Health     Financial Resource Strain:     Difficulty of Paying Living Expenses:    Food Insecurity:     Worried About [place] in the Last Year:     [place] in the Last Year:    Transportation Needs:     Film/video editor (Medical):     Lack of Transportation (Non-Medical):    Physical Activity:     Days of Exercise per Week:     Minutes of Exercise per Session:    Stress:      Feeling of Stress :    Social Connections:     Frequency of Communication with Friends and Family:     Frequency of Social Gatherings with Friends and Family:     Attends Religious Services:     Active Member of Clubs or Organizations:     Attends Music therapist:     Marital Status:    Intimate Partner Violence:     Fear of Current or Ex-Partner:     Emotionally Abused:     Physically Abused:     Sexually Abused:       Social history was reviewed and is non-contributory to this illness.    Family History:     Family history was reviewed and is non-contributory to this illness.

## 2019-11-28 NOTE — Plan of Care (Signed)
Problem: Body Temperature Imbalanced, at Risk or Actual - Adult  Goal: Body temperature within specified parameters  Outcome: Progress within 12 hours     Problem: GI Elimination Impaired - Adult  Goal: Passage of soft, formed stool  Outcome: Progress within 12 hours     Problem: Fluid Volume, Imbalanced - Adult  Goal: Absence of fluid imbalance signs and symptoms  Outcome: Progress within 24 hours  Note: Plan for HD tomorrow     Problem: Self-care Deficit - Medical Condition - Adult  Goal: Completes grooming / oral care / bathing, with assistance as needed  Outcome: Progress within 24 hours     Problem: Discharge Planning - Adult  Goal: Knowledge of and participation in plan of care  Outcome: Progress within 72 hours  Note: Patient intubated and sedation     Problem: Airway, Ineffective - Respiratory Condition - Adult  Goal: Patent airway / effective airway clearance  Outcome: Progress within 72 hours     Problem: Deep Venous Thrombosis, Risk of - Respiratory Condition - Adult  Goal: Absence of deep venous thrombosis  Outcome: Progress within 72 hours     Problem: Gas Exchange, Impaired- Respiratory Condition - Adult  Goal: Adequate oxygenation (absence of signs and symptoms of hypoxemia)  Outcome: Progress within 72 hours  Goal: Adequate ventilation (absence of signs and symptoms of hypercarbia)  Outcome: Progress within 72 hours     Problem: Pressure Ulcer,  At Risk and Actual- Adult  Goal: Participation in preventative efforts and treatment plan (Patient/Family/Caregiver)  Outcome: Progress within 72 hours  Goal: Adequate nutritional intake  Outcome: Progress within 72 hours     Problem: Electrolyte Imbalance - Apheresis Hemodialysis Patient - Adult  Goal: Dialyzed with appropriate bath based on lab values  Outcome: Progress within 72 hours     Problem: Activity Intolerance - Adult  Goal: Able to perform physical activity as ordered  Outcome: Progress within 72 hours     Problem: Anxiety, Patient / Family /  Caregiver - Adult  Goal: Alleviation of anxiety (Patient / Family / Caregiver)  Outcome: Progress within 72 hours  Goal: Expression of calm and reduction in anxiety symptoms  Outcome: Progress within 72 hours     Problem: Bleeding, at Risk or Actual - Adult  Goal: Absence of impaired coagulation signs and symptoms / active bleeding  Outcome: Progress within 72 hours     Problem: Coping Ineffective, Patient / Family / Caregiver - Adult  Goal: Effective coping (Patient / Family / Caregiver)  Outcome: Progress within 72 hours     Problem: Deep Venous Thrombosis, Risk of - Adult  Goal: Absence of deep venous thrombosis  Outcome: Progress within 72 hours     Problem: Gas Exchange, Impaired - Adult  Goal: Adequate oxygenation (absence of signs and symptoms of hypoxemia)  Outcome: Progress within 72 hours  Goal: Adequate ventilation (absence of signs and symptoms of hypercarbia)  Outcome: Progress within 72 hours     Problem: Infection, at Risk and Actual - Adult  Goal: Prevention of infection  Outcome: Progress within 72 hours  Goal: Resolution of Infection  Outcome: Progress within 72 hours     Problem: Mobility, Impaired - Adult  Goal: Able to perform exercises as instructed  Outcome: Progress within 72 hours     Problem: Nausea/Vomiting - Adult  Goal: Absence of nausea and vomiting  Outcome: Progress within 72 hours     Problem: Nutrition, Alteration in - Adult  Goal: Maximize nutritional intake per  patient condition  Outcome: Progress within 72 hours     Problem: Pain,  Acute / Chronic - Adult  Goal: Control of acute pain  Outcome: Progress within 72 hours  Goal: Control of chronic pain  Outcome: Progress within 72 hours  Goal: Involvement in pain management plan  Outcome: Progress within 72 hours     Problem: Skin Integrity, Impaired - Adult  Goal: Skin/wound healing  Outcome: Progress within 72 hours     Problem: Sleep Pattern Disturbance - Adult  Goal: Optimal balance of rest and activity demonstrated or reported   Outcome: Progress within 72 hours     Problem: Airway, Ineffective - Respiratory Condition - Adult  Goal: Patent airway / effective airway clearance  Outcome: Progress within 72 hours     Problem: Body Temperature Imbalanced, at Risk or Actual - Adult  Goal: Body temperature within specified parameters  Outcome: Progress within 12 hours     Problem: GI Elimination Impaired - Adult  Goal: Passage of soft, formed stool  Outcome: Progress within 12 hours     Problem: Anxiety, Patient / Family / Caregiver - Adult  Goal: Alleviation of anxiety (Patient / Family / Caregiver)  Outcome: Progress within 72 hours     Problem: Pressure Ulcer,  At Risk and Actual- Adult  Goal: Absence of any new pressure ulcers  Outcome: Not Progressing  Note: New DTI found today    Goal: Pressure ulcer healing / stabilization  Outcome: Not Progressing     Problem: Glycemic Control, Abnormal - Adult  Goal: Maintain glucose levels within specified parameters  Outcome: Progress - Moderate (non-inpatient)

## 2019-11-28 NOTE — Plan of Care (Signed)
New SDTI observed- Provider notified. Wound consult made. Waffle overlay applied while awaiting specialty bed.    Problem: Pressure Ulcer,  At Risk and Actual- Adult  Goal: Absence of any new pressure ulcers  Outcome: Progress within 12 hours  Goal: Pressure ulcer healing / stabilization  Outcome: Progress within 72 hours  Goal: Participation in preventative efforts and treatment plan (Patient/Family/Caregiver)  Outcome: Progress within 48 hours  Goal: Adequate nutritional intake  Outcome: Progress within 24 hours

## 2019-11-28 NOTE — Consults (Signed)
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    Wound care consulted for bialteral buttocks discoloration.    Braden: 11    Image:        Type of Wound: Pressure Injury vs skin failure    Stage of wound: Evolving DTI    Evaluation/Assessment of wound: Evolving DTI measured 8.5 x 7.5cm with intact dark purple bilateral buttocks and sacrum.    Recommendations:  R/L side lying only unless medically contraindicated.  Turn and reposition Q 2 hours unless medically contraindicated  HOB 30 degrees or lower unless medically contraindicated  Minimal layers of linens under patient  Float heels or Z flow boots  Patients with medical device-rotate device site and pad device  Incontinent patients use topical moisture barriers; use absorbent pads  No diapers/briefs while patient in bed  Use lift and HoverMatt to turn, lift or reposition pt.  Use waffle cushion if pt sitting up in chair  Maximize OT/PT  Educate patient and family regarding pressure injury prevention  Initiate "Pressure Ulcer at Risk or Actual" Nursing Care Plan  Currently on a waffle before specialty bed comes.  [facility] Specialty bed ordered    Bilateral buttocks DTI wound care- Cleanse with Anasept, air dry apply Hydraguard TID and PRN.  No Back lying. No Mepilex Do Not Cover.      Recommendations communicated to: Bedside RN, Eugene Garnet and [place] of Care: Will continue to follow while pt hospitalized.      DSandman, RN, MSN, [facility], [place]

## 2019-11-28 NOTE — Progress Notes (Signed)
CRITICAL CARE PROGRESS NOTE       24 Hour Events:  - NAE    Subjective:  - Intubated, sedated     Medications:   Scheduled Meds:   0.9% sodium chloride flush  3 mL Intravenous Q8H [place]    acetaminophen  1,000 mg Feeding Tube Q8H [place]    atorvastatin  40 mg Feeding Tube Q PM [place]    B-complex with vitamin C-Folic Acid  1 tablet Per OG Tube Daily [place]    heparin flush  300 Units Intravenous Daily [place]    insulin aspart  0-40 Units Subcutaneous Q4H SCH (Insulin)    lansoprazole  30 mg Feeding Tube Q AM Before Breakfast [place]    meropenem  1,000 mg Intravenous Q PM [place]    vancomycin  500 mg Intravenous After Hemodialysis    white petrolatum-mineral oil  1 Application Both Eyes 4x Daily [place]     Continuous Infusions:   dextrose Stopped (11/25/19 1722)    fentaNYL 25 mcg/hr (11/28/19 0700)    norEPINEPHrine Stopped (11/26/19 1105)    propofoL 30 mcg/kg/min (11/28/19 0700)     PRN Meds:    0.9% sodium chloride flush  3 mL Intravenous PRN    dextrose  0-150 mL/hr Intravenous Continuous PRN    dextrose 50%  6.25 g Intravenous Q15 Min PRN    fentaNYL citrate  25-50 mcg Intravenous Q15 Min PRN    glucose  20 g Oral Q15 Min PRN    heparin flush  300 Units Intravenous PRN    hydrALAZINE  5 mg Intravenous Q2H PRN    labetalol  10 mg Intravenous Q2H PRN    lidocaine (PF)  5 mL Subcutaneous PRN       NEUROLOGIC  Exam: Sedated  CPOT: Total  Min: 0  Max: 3  Pain meds:   - Tylenol 1,000 mg q8h [place]  - Fentanyl gtt @ 25  - Fentanyl PRN total 325 mcg /24 hours   RASS: Moderate sedation, Any movement (but no eye contact) to voice  Sedation meds:   - Propofol @ 30 mcg   CAM-ICU: Positive    CARDIOVASCULAR  Pulse  Min: 60  Max: 84  Most recent: 66  No data recorded  Most recent: (!) 152/51; No data recorded  Most recent: 71 mmHg  Vasoactives: None, labetalol 10 mg for SBP < 140   Exam: RRR    No results found in last 72 hours    Temp:  [36.5 C (97.7 F)-36.7 C (98.1 F)] 36.7 C (98.1 F)  Heart Rate:  [60-84] 66  Arterial  Line BP (mmHg) : (106-162)/(39-63) 125/47  *Resp:  [10-18] 10  FiO2 (%):  [30 %-50 %] 30 %  SpO2:  [93 %-98 %] 94 %    RESPIRATORY:  Resp  Min: 10  Max: 18  Most recent: (!) 10  SpO2  Min: 93 %  Max: 98 %  Most recent: 94 %  Exam: Intubated, mechanically ventilated   Oxygen/ventilatory therapy:  Mode: CMV (06/14 0515)  RR Set: 10 (06/14 0515)  Flow Rate Set (lpm): 55 (06/14 0515)  Tidal Volume Set: 450 mL (06/14 0515)  PEEP/CPAP Set (cm H2O): 8 cm H2O (06/14 0515)  FiO2 (%): 30 % (06/14 0700)  Recent Labs     11/26/19  1259 11/26/19  0735 11/26/19  0406   PH37 7.45 7.37 7.37   PCO2 42 49* 48*   PO2 86 70* 67*   HCO3 29 28  28   SAO2 98 94* 93*   FIO2 Not specified 40.00 40.00     CXR:   No results found.    GASTROINTESTINAL  Exam: Soft, nontender, non distended  Diet: TF goal 50 cc/hr     Stool x 1     No results for input(s): TBILI, DBILI, AST, ALT, ALKP, GGT, ALB in the last 72 hours.    RENAL  Recent Labs     11/28/19  0409 11/27/19  0233 11/26/19  0405   NA 135 135 134*   K 3.4* 3.4* 4.1   CL 100* 100* 99*   CO2 _0 BUN 40* 28* 39*   CREAT 5.00* 4.14* 5.69*   GLU 118 168 101   CA 9.4 8.9 8.7   MG 2.2 2.1 1.9   PO4 3.9 3.0 4.7     Fluids (last 24 hours):    Intake/Output Summary (Last 24 hours) at 11/28/2019 0732  Last data filed at 11/28/2019 0700  Gross per 24 hour   Intake 1211.24 ml   Output    Net 1211.24 ml     Last HD Saturday     HEMATOLOGY  Recent Labs     11/27/19  0233 11/26/19  0405 11/26/19  0009 11/25/19  1612 11/25/19  0952   HGB 7.8* 8.0* 8.1* 8.0* 8.1*   HCT 25.3* 26.4* 27.0* 26.1* 25.8*   PLT 304 276 284 275 279     Blood products (last 24 hours): None  Anticoagulation: None    INFECTIOUS DISEASE/IMMUNOLOGIC  Temp  Min: 36.5 C (97.7 F)  Max: 36.7 C (98.1 F)  Most recent: 36.7 C (98.1 F)  Recent Labs     11/27/19  0233 11/26/19  0405 11/26/19  0009 11/25/19  1612 11/25/19  0952   WBC 14.0* 16.9* 15.4* 15.7* 14.7*     Microbiology results:  Microbiology Results (last 72 hours)      Procedure Component Value Units Date/Time    Central Blood Culture [[id number]] Collected: 11/23/19 1657    Order Status: Completed Specimen: Central Blood Updated: 11/28/19 0442     Central Blood Culture No growth 5 days.    Peripheral Blood Culture [[id number]] Collected: 11/23/19 1711    Order Status: Completed Specimen: Peripheral Blood Updated: 11/28/19 0440     Peripheral Blood Culture No growth 5 days.        Antimicrobials: Meropenem, vancomycin (dialysis dosing)    ENDOCRINE  Blood Glucose (mg/dL)- manual entry  Min: 116  Max: 200  Most recent: 116  Recent Labs     11/28/19  0409 11/27/19  0233 11/26/19  0405   GLU 118 168 101     Insulin orders: ISS    Imaging within last 24 hours:  No results found.    Pending Work-up:  Pending Labs     Order Current Status    COVID-19 RNA, Qualitative Screening (Asymptomatic and No Specific COVID-19 Suspicion); No; No; No Collected (11/26/19 0009)    Add On Differential In process    Central Blood Culture Preliminary result    Peripheral Blood Culture Preliminary result          ASSESSMENT/PLAN:  William Sherman is a 64 y.o. 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 No ENT or NIR intervention possible, and patient has been transitioned to DNAR/DNI with  plans to transfer to hospital closer to patient's family prior to going hospice.     Patient on list for transfer to [facility] ICU pending availability     NEURO:  # Pain  - APAP  - Fentanyl gtt + PRN    # Sedation  - Propofol    CV:   # SBP < 140 in setting of internal carotid bleeding  - Labetalol PRN     RESP:   # Intubated for airway protection  - SBT  - Consider extubation if patient does well on SBT    GI:   # NPO, TF    RENAL:   # ESRD on iHD  - Nephrology following, T/Th/Sa schedule      HEME:   # Ongoing bleeding  - ENT packing in place   - Monitor CBC     ID:   #  Skull bas osteomyelitis  - Vanc, meropenem    ENDO:   # DMT2  - ISS    ICU BUNDLE  Code status: Code Status: DNAR/DNI      Patient Lines/Drains/Airways Status    Active LDAs     Name:   Placement date:   Placement time:   Site:   Days:    PSI Introducer 11/22/19 Left Internal jugular   11/22/19    1430       5    Peripheral IV 11/21/19 Anterior;Right External Jugular   11/21/19        External Jugular   7    Arterial Line 11/23/19 Femoral   11/23/19    0054     5    Arteriovenous Access AV Fistula Right;Upper Arm                Arteriovenous Access AV Fistula Left;Lower Arm                Wound 11/22/19 Other (Comment) Arm Distal;Right;Upper;Anterior   11/22/19    1731    Arm   5    GI Drain 11/21/19 Orogastric Center mouth   11/21/19         7                PT/OT: No  DVT prophylaxis: None  Stress Ulcer prophylaxis: Pepcid  Diet: NPO, TF    Bowel regimen: None  Dispo: ICU     I discussed the care of this patient with ICU attending and fellow.     Please contact the Critical Care Service with questions:  13 ICU: ph [phone], pgr [phone]    Elias Else, MD  11/28/19  Critical Care Medicine Service

## 2019-11-28 NOTE — Consults (Signed)
Patient:  William Sherman    [facility] NP  [place] Attending: Dr. Karene Fry    64 y.o. male neuro IR consulted for epistaxis 2/2 R ICA extravasation/invasion from [facility], no angiogram or embolization this admission    Blood pressure (!) 152/51, pulse 66, temperature 36.7 C (98.1 F), temperature source Oral, resp. rate (!) 10, height 172.7 cm (_0 ), weight 87 kg (191 lb 12.8 oz), SpO2 94 %.  Intubated, sedated, no neuro exam  Pedal pulses intact    Pertinent labs:     Lab Results   Component Value Date/Time    CREAT 5.00 (H) 11/28/2019 04:09 AM    GFRC 12 (L) 11/28/2019 04:09 AM    GFRAA 13 (L) 11/28/2019 04:09 AM    INR 1.5 (H) 11/23/2019 04:13 AM    PLT 304 11/27/2019 02:33 AM    HCT 25.3 (L) 11/27/2019 02:33 AM    WBC 14.0 (H) 11/27/2019 02:33 AM        AP:  64 y.o. male neuro IR consulted for epistaxis 2/2 R ICA extravasation/invasion from [place], no angiogram or embolization. Patient has no appreciable neuro exam due to propofol sedation.   - Family requesting patient transferred closer to home for comfort care  - Signing off.  If necessary, please re-consult NIR by contacting the fellow on-call at [phone] AND entering the consult request in APeX.      Rodena Goldmann, NP  NIR Pager [phone]    Total service time: 15 Minutes including direct patient care, coordination of care, counseling, and interdisciplinary communication

## 2019-11-28 NOTE — Consults (Addendum)
NEPHROLOGY FOLLOW UP CONSULT NOTE     Events  No events overnight.  Pending possible transfer back to [place]  Intubated/sedated    Review of Systems    Review of systems unobtainable due to intubated    Vitals  Temp:  [1 C (33.8 F)-36.7 C (98.1 F)] 1 C (33.8 F)  Heart Rate:  [60-84] 61  *Resp:  [10-18] 10  Arterial Line BP (mmHg) : (106-162)/(38-63) 114/38  FiO2 (%):  [30 %] 30 %  SpO2:  [93 %-97 %] 93 %    Most Recent Weight: 87 kg (191 lb 12.8 oz)  Admission Weight: (S) 84.4 kg (186 lb 1.1 oz)      Intake/Output Summary (Last 24 hours) at 11/28/2019 1048  Last data filed at 11/28/2019 0900  Gross per 24 hour   Intake 1258.77 ml   Output    Net 1258.77 ml       Current IP Medications  Scheduled Meds:   0.9% sodium chloride flush  3 mL Intravenous Q8H [place]    acetaminophen  1,000 mg Feeding Tube Q8H [place]    atorvastatin  40 mg Feeding Tube Q PM [place]    B-complex with vitamin C-Folic Acid  1 tablet Per OG Tube Daily [place]    heparin flush  300 Units Intravenous Daily [place]    insulin aspart  0-40 Units Subcutaneous Q4H SCH (Insulin)    lansoprazole  30 mg Feeding Tube Q AM Before Breakfast [place]    meropenem  1,000 mg Intravenous Q PM [place]    vancomycin  500 mg Intravenous After Hemodialysis    white petrolatum-mineral oil  1 Application Both Eyes 4x Daily [place]     Continuous Infusions:   dextrose 50 mL/hr (11/28/19 0918)    fentaNYL 25 mcg/hr (11/28/19 0900)    norEPINEPHrine Stopped (11/26/19 1105)    propofoL 40.0869 mcg/kg/min (11/28/19 0900)     PRN Meds:   0.9% sodium chloride flush  3 mL Intravenous PRN    dextrose  0-150 mL/hr Intravenous Continuous PRN    dextrose 50%  6.25 g Intravenous Q15 Min PRN    fentaNYL citrate  25-50 mcg Intravenous Q15 Min PRN    glucose  20 g Oral Q15 Min PRN    heparin flush  300 Units Intravenous PRN    hydrALAZINE  5 mg Intravenous Q2H PRN    labetalol  10 mg Intravenous Q2H PRN    lidocaine (PF)  5 mL Subcutaneous PRN       Physical Exam   General: critically ill, sedated  Eyes: normal conjunctivae, anicteric sclerae  ENMT: normal external nose and ears; mucous membranes moist; ETT in place  Respiratory: coarse upper airway sounds  Cardiovascular: regular rate & rhythm; normal s1/s2; no rub  Gasterointestional: soft abdomen, non-tender, non-distended, positive active bowel sounds  Musculoskeletal: no clubbing/cyanosis, 2+ edema  Skin/Integumentary: no obvious rash  Neurologic: sedated  Psychiatric: sedated  Heme/lymphatic: no excessive bruising or bleeding  Dialysis access: RAVF    Data  All labs available in last 3 days in APEX reviewed.          11/28/19  0409 11/27/19  0233 11/26/19  1259 11/26/19  0735 11/26/19  0406 11/26/19  0405 11/26/19  0009   WBC  --  14.0*  --   --   --  16.9* 15.4*   HCT  --  25.3*  --   --   --  26.4* 27.0*  PLT  --  304  --   --   --  276 284   NA 135 135  --   --   --  134*  --    K 3.4* 3.4*  --   --   --  4.1  --    CL 100* 100*  --   --   --  99*  --    CO2 24 26  --   --   --  27  --    BUN 40* 28*  --   --   --  39*  --    CREAT 5.00* 4.14*  --   --   --  5.69*  --    GLU 118 168  --   --   --  101  --    CA 9.4 8.9  --   --   --  8.7  --    MG 2.2 2.1  --   --   --  1.9  --    PO4 3.9 3.0  --   --   --  4.7  --    PH37  --   --  7.45 7.37 7.37  --   --    PCO2  --   --  42 49* 48*  --   --    PO2  --   --  86 70* 67*  --   --            Assessment and Recommendations  William Sherman is a 64 y.o. male ESKDon HD TTS via AVF, diabetes, prior stroke,w/ right nasopharyngeal tumor who presents after high volume epistaxis with hemoglobin drop and requiring intubation for airway protection. Nonsurgical candidate, awaiting transfer back to a hospital closer to family's home prior to transitioning to hospice care.    #ESKD  -Plan HD TTS while admitted. RAVF appears slightly swollen, will cannulate around areas of skin breakdown    #HTN  Well controlled off anti-HTN  -Continue to hold home anti-htn    #Volume  Euvolemic on  exam  -Will aim to keep net even    #Hyperkalemia  Potassium well controlled with dialysis  -Low potassium diet if taking PO/tube feeds    #Anemia  Hemoglobin 7. Previously on 15K units of EPO/week as an outpatient which ~ 31mg darbepoetin.  -S/p 10520m darbepoetin 6/8  -Given goals of care, will not continue ESA administration with dialysis    #Mineral Bone Disease  Calcium and phos normal. On hecterol 4.20m520mqHD~zemplar 57m34m -Given goals of care, will not continue activated vitamin D with dialysis    William Sherman  [facility] ([phone]/14/21    -----------------------------------------------------------------------------------------  Please see below for the appropriate Nephrology consult pager to reach us: [place] [facility] Pager (all hours)   [facility] new and established consults [phone] new and established consults [phone] -  all new consults [phone] - established consults [phone] - established consults [phone] established [phone]

## 2019-11-29 DIAGNOSIS — J988 Other specified respiratory disorders: Secondary | ICD-10-CM

## 2019-11-29 LAB — BASIC METABOLIC PANEL (NA, K,
Anion Gap: 12 (ref 4–14)
Calcium, total, Serum / Plasma: 9.4 mg/dL (ref 8.4–10.5)
Carbon Dioxide, Total: 23 mmol/L (ref 22–29)
Chloride, Serum / Plasma: 97 mmol/L — ABNORMAL LOW (ref 101–110)
Creatinine: 5.43 mg/dL — ABNORMAL HIGH (ref 0.73–1.24)
Glucose, non-fasting: 155 mg/dL (ref 70–199)
Potassium, Serum / Plasma: 3.9 mmol/L (ref 3.5–5.0)
Sodium, Serum / Plasma: 132 mmol/L — ABNORMAL LOW (ref 135–145)
Urea Nitrogen, Serum / Plasma: 48 mg/dL — ABNORMAL HIGH (ref 7–25)
eGFR - high estimate: 12 mL/min — ABNORMAL LOW (ref >59)
eGFR - low estimate: 10 mL/min — ABNORMAL LOW (ref >59)

## 2019-11-29 LAB — POCT GLUCOSE
Glucose, iSTAT: 188 mg/dL (ref 70–199)
Glucose, iSTAT: 195 mg/dL (ref 70–199)
Glucose, iSTAT: 167 mg/dL (ref 70–199)
Glucose, iSTAT: 166 mg/dL (ref 70–199)
Glucose, iSTAT: 102 mg/dL (ref 70–199)

## 2019-11-29 LAB — ARTERIAL BLOOD GAS ONLY
Base excess: 4 mmol/L
Bicarbonate: 28 mmol/L (ref 23–29)
FIO2: 30 % (ref 20–100)
Oxygen Saturation: 99 % (ref 95–100)
PCO2: 45 mm Hg (ref 32–46)
PO2: 105 mm Hg (ref 83–108)
pH, Blood: 7.41 (ref 7.35–7.45)

## 2019-11-29 LAB — PHOSPHORUS, SERUM / PLASMA: Phosphorus, Serum / Plasma: 3.8 mg/dL (ref 2.3–4.7)

## 2019-11-29 LAB — PERIPHERAL BLOOD CULTURE: Peripheral Blood Culture: NO GROWTH

## 2019-11-29 LAB — CENTRAL BLOOD CULTURE: Central Blood Culture: NO GROWTH

## 2019-11-29 LAB — MAGNESIUM, SERUM / PLASMA: Magnesium, Serum / Plasma: 2.1 mg/dL (ref 1.6–2.6)

## 2019-11-29 MED ORDER — AMLODIPINE 10 MG TABLET
10 mg | ORAL | Status: DC
  Administered 2019-11-30 – 2019-12-01 (×2): 10 mg via GASTROSTOMY

## 2019-11-29 MED ORDER — SODIUM CHLORIDE 0.9 % IV BOLUS
0.9 | INTRAVENOUS | Status: DC | PRN
Start: 2019-11-29 — End: 2019-11-29

## 2019-11-29 MED ORDER — LIDOCAINE 4 % TOPICAL CREAM
4 | Freq: Once | TOPICAL | Status: DC | PRN
Start: 2019-11-29 — End: 2019-11-29

## 2019-11-29 MED ORDER — HYDRALAZINE 50 MG TABLET
50 mg | ORAL | Status: DC
  Administered 2019-11-29 – 2019-11-30 (×4): 50 mg via ORAL

## 2019-11-29 MED ORDER — SODIUM CHLORIDE 0.9 % DIALYSIS CIRCUIT
0.9 | INTRAVENOUS | Status: DC | PRN
Start: 2019-11-29 — End: 2019-11-29

## 2019-11-29 MED ORDER — LABETALOL 5 MG/ML INTRAVENOUS SOLUTION
5 | Freq: Once | INTRAVENOUS | Status: AC
Start: 2019-11-29 — End: 2019-11-29
  Administered 2019-11-30: 03:00:00 20 mg via INTRAVENOUS

## 2019-11-29 MED ORDER — HYDROMORPHONE (PF) 0.5 MG/0.5 ML INJECTION SYRINGE
0.5 | Freq: Once | INTRAMUSCULAR | Status: AC
Start: 2019-11-29 — End: 2019-11-29
  Administered 2019-11-30: 03:00:00 0.5 mg via INTRAVENOUS

## 2019-11-29 MED ORDER — AMLODIPINE 10 MG TABLET: 10 mg | ORAL | Status: DC

## 2019-11-29 MED ORDER — HYDRALAZINE 20 MG/ML INJECTION SOLUTION
20 | Freq: Once | INTRAMUSCULAR | Status: AC
Start: 2019-11-29 — End: 2019-11-29
  Administered 2019-11-30: 01:00:00 20 mg via INTRAVENOUS

## 2019-11-29 MED ORDER — AMLODIPINE 5 MG TABLET
5 | Freq: Once | ORAL | Status: AC
Start: 2019-11-29 — End: 2019-11-29
  Administered 2019-11-29: 18:00:00 5 mg via GASTROSTOMY

## 2019-11-29 MED ORDER — HYDRALAZINE 20 MG/ML INJECTION SOLUTION
20 | INTRAMUSCULAR | Status: DC | PRN
Start: 2019-11-29 — End: 2019-12-01
  Administered 2019-11-30 (×3): 10 mg via INTRAVENOUS

## 2019-11-29 MED ORDER — LIDOCAINE (PF) 10 MG/ML (1 %) INJECTION SOLUTION
10 | INTRAMUSCULAR | Status: DC | PRN
Start: 2019-11-29 — End: 2019-11-29

## 2019-11-29 MED ORDER — SODIUM CHLORIDE 0.9 % DIALYSIS CIRCUIT
0.9 | Freq: Once | INTRAVENOUS | Status: AC
Start: 2019-11-29 — End: 2019-11-29
  Administered 2019-11-29: 23:00:00 200 mL via INTRAVENOUS

## 2019-11-29 MED ORDER — SODIUM CHLORIDE 0.9 % DIALYSIS CIRCUIT
0.9 | Freq: Once | INTRAVENOUS | Status: AC
Start: 2019-11-29 — End: 2019-11-29
  Administered 2019-11-29: 20:00:00 200 mL via INTRAVENOUS

## 2019-11-29 MED FILL — NEPHRO-VITE 0.8 MG TABLET: 0.8 mg | ORAL | Qty: 1

## 2019-11-29 MED FILL — HYDRALAZINE 50 MG TABLET: 50 mg | ORAL | Qty: 1

## 2019-11-29 MED FILL — VANCOMYCIN 500 MG INTRAVENOUS SOLUTION: 500 mg | INTRAVENOUS | Qty: 10

## 2019-11-29 MED FILL — MEROPENEM 1 GRAM INTRAVENOUS SOLUTION: 1 gram | INTRAVENOUS | Qty: 1000

## 2019-11-29 MED FILL — LANSOPRAZOLE 30 MG DELAYED RELEASE,DISINTEGRATING TABLET: 30 mg | ORAL | Qty: 1

## 2019-11-29 MED FILL — AMLODIPINE 5 MG TABLET: 5 mg | ORAL | Qty: 1

## 2019-11-29 MED FILL — ATORVASTATIN 40 MG TABLET: 40 mg | ORAL | Qty: 1

## 2019-11-29 MED FILL — DIPRIVAN 10 MG/ML INTRAVENOUS EMULSION: 10 mg/mL | INTRAVENOUS | Qty: 100

## 2019-11-29 MED FILL — HYDRALAZINE 20 MG/ML INJECTION SOLUTION: 20 mg/mL | INTRAMUSCULAR | Qty: 1

## 2019-11-29 MED FILL — ACETAMINOPHEN 500 MG TABLET: 500 mg | ORAL | Qty: 2

## 2019-11-29 NOTE — Plan of Care (Signed)
Problem: Electrolyte Imbalance - Apheresis Hemodialysis Patient - Adult  Goal: Dialyzed with appropriate bath based on lab values  Note: Dialyzed with 3.5K bath for K of 3.9

## 2019-11-29 NOTE — Consults (Signed)
NEPHROLOGY FOLLOW UP CONSULT NOTE     Events  Hypertensive - receiving when necessary IV hydralazine; started on amlodipine  Pending possible transfer back to [facility]  possible transition to comfort care    I personally examined the patient on hemodialysis today.  Indication: ESRD  Prescription:  Treatment Type HD Only   HD Duration (Hrs) 3   UF goal for HD (kg) 1.5-2   K+ 3.5 mEq/L   Ca++ 2.5 mEq/L   Na+ 138 mEq/L   Bicarb 35 mEq/L   Dialysate Flow 2 x Autoflow   Dialysate Temperature (C) 37   Na+ Model Program None   UF Profile for HD None   Access Type AVF   BFR-As tolerated to a maximum of: 350 mL/min   Dialyzer F160 (83 mL)   Machine Lines Adult (108 mL)   Crit-line RN Discretion   Goal BP type SBP   Goal SBP> 100     Subjective  Intubated/sedated    Review of Systems    Review of systems unobtainable due to intubated    Vitals  Temp:  [36.3 C (97.3 F)-36.9 C (98.5 F)] 36.3 C (97.3 F)  Heart Rate:  [72-80] 73  *Resp:  [10-21] 11  BP: (117-160)/(40-54) 127/45  Arterial Line BP (mmHg) : (117-152)/(39-62) 127/39  FiO2 (%):  [30 %] 30 %  SpO2:  [93 %-99 %] 96 %    Most Recent Weight: 89 kg (196 lb 3.4 oz)  Admission Weight: (S) 84.4 kg (186 lb 1.1 oz)      Intake/Output Summary (Last 24 hours) at 11/29/2019 2348  Last data filed at 11/29/2019 2300  Gross per 24 hour   Intake 2307.31 ml   Output 2600 ml   Net -292.69 ml       Current IP Medications  Scheduled Meds:   0.9% sodium chloride flush  3 mL Intravenous Q8H [place]    acetaminophen  1,000 mg Feeding Tube Q8H [place]    [START ON 11/30/2019] amLODIPine  10 mg Feeding Tube Daily [place]    atorvastatin  40 mg Feeding Tube Q PM     B-complex with vitamin C-Folic Acid  1 tablet Per OG Tube Daily [place]    heparin flush  300 Units Intravenous Daily [place]    hydrALAZINE  50 mg Oral Q8H [place]    insulin aspart  0-40 Units Subcutaneous Q4H SCH (Insulin)    lansoprazole  30 mg Feeding Tube Q AM Before Breakfast [place]    meropenem  1,000 mg Intravenous Q PM [place]     vancomycin  500 mg Intravenous After Hemodialysis    white petrolatum-mineral oil  1 Application Both Eyes 4x Daily [place]     Continuous Infusions:   dextrose 50 mL/hr (11/29/19 2300)    fentaNYL 25 mcg/hr (11/29/19 2300)    propofoL 20 mcg/kg/min (11/29/19 2334)     PRN Meds:   0.9% sodium chloride flush  3 mL Intravenous PRN    dextrose  0-150 mL/hr Intravenous Continuous PRN    dextrose 50%  6.25 g Intravenous Q15 Min PRN    fentaNYL citrate  25-50 mcg Intravenous Q15 Min PRN    glucose  20 g Oral Q15 Min PRN    heparin flush  300 Units Intravenous PRN    hydrALAZINE  10 mg Intravenous Q2H PRN    labetalol  10 mg Intravenous Q2H PRN    lidocaine (PF)  5 mL Subcutaneous PRN       Physical Exam  General: critically ill, sedated  Eyes: normal conjunctivae, anicteric sclerae  ENMT: normal external nose and ears; mucous membranes moist; ETT in place  Respiratory: coarse upper airway sounds  Cardiovascular: regular rate & rhythm; normal s1/s2; no rub  Gasterointestional: soft abdomen, non-tender, non-distended, positive active bowel sounds  Musculoskeletal: no clubbing/cyanosis, 2+ edema  Skin/Integumentary: no obvious rash  Neurologic: sedated  Psychiatric: sedated  Heme/lymphatic: no excessive bruising or bleeding  Dialysis access: RAVF    Data  All labs available in last 3 days in APEX reviewed.          11/29/19  0210 11/28/19  1819 11/28/19  0409 11/27/19  0233   WBC  --   --   --  14.0*   HCT  --   --   --  25.3*   PLT  --   --   --  304   NA 132*  --  135 135   K 3.9  --  3.4* 3.4*   CL 97*  --  100* 100*   CO2 23  --  24 26   BUN 48*  --  40* 28*   CREAT 5.43*  --  5.00* 4.14*   GLU 155  --  118 168   CA 9.4  --  9.4 8.9   MG 2.1  --  2.2 2.1   PO4 3.8  --  3.9 3.0   PH37  --  7.41  --   --    PCO2  --  45  --   --    PO2  --  105  --   --            Assessment and Recommendations  William Sherman is a 64 y.o. male ESKDon HD TTS via AVF, diabetes, prior stroke,w/ right nasopharyngeal tumor who presents  after high volume epistaxis with hemoglobin drop and requiring intubation for airway protection. Nonsurgical candidate, awaiting transfer back to a hospital closer to family's home prior to transitioning to hospice care.    #ESKD  -Plan HD TTS while admitted. RAVF appears slightly swollen, will cannulate around areas of skin breakdown.  Because of possible transition to comfort care, will defer evaluation at this time    #HTN  adequately controlled on current BP medications    #Volume  probable mild volume overload; difficult to assess by physical exam    #Hyperkalemia  Potassium well controlled with dialysis  -Low potassium diet if taking PO/tube feeds    #Anemia  recommend continuing Aranesp 100 g IV weekly    #Mineral Bone Disease  Calcium and phos normal. On hecterol 4.62mg qHD~zemplar 132m.  will continue treatment unless transition to comfort care is made    [facility] FurlD  11/29/19  Pager [phone]

## 2019-11-29 NOTE — Progress Notes (Signed)
CRITICAL CARE PROGRESS NOTE     24 Hour Course  - Patient was hypertensive with SBP > 160 mmHg, required PRN Labetalol 10 IV mg x 3 (total 30 mg) and PRN Hydralazine 5 mg IV x 4 (total 20 mg) and was started on Amlodipine 5 mg PO yesterday. Patient's Amlodipine dose was increased to 10 mg PO and was started on Hydralazine 50 mg PO TID this am, both of which are outpatient medications.   - Stopped enteral tube feedings for 650 mL residual yesterday afternoon. Now on D10 W @ TF replacement rate.   - Patient's sedation was titrated Propofol and Fentanyl infusions to off and the is more responsive, she is currently undergoing iHD, will place on PS trial once iHD run complete.    - Discussed disposition with William Sherman from Case Management, no option to transfer to [facility] area hospital for transition to comfort focused care. Family would like to transition to comfort care close to home in the [place] [facility] area if possible, Case Management will investigate if there are inpatient and home hospice options in the [place]. Family informed that, patient may need to transition to comfort focused care here at [facility]. Spoke with the patient's daughter, William Sherman.     Subjective  Patient intubated and critically ill, unable to provide subjective interview.     Vitals  HR: Pulse  Min: 60  Max: 80; Heart Rate: 76 (11/29/19 0600)  ABP: Arterial Line BP (mmHg)   Min: 99/43  Max: 167/63; 123/42 (11/29/19 0600)  AMAP: Arterial Line MAP (mmHg)  Min: 60 mmHg  Max: 100 mmHg; 63 mmHg (11/29/19 0600)  NBP: No data recorded; (!) 152/51 (11/26/19 1150)  NMAP: No data recorded; 71 mmHg (11/24/19 2000)  RR: Resp  Min: 10  Max: 18; (!) 10 (11/29/19 0600)  SpO2: SpO2  Min: 93 %  Max: 99 %; 95 % (11/29/19 0600)  Temp: Temp  Min: 1 C (33.8 F)  Max: 1 C (33.8 F); (!) 1 C (33.8 F) (11/28/19 0920)    Intake/Output  (from 0700 yesterday to 0700 today)  Intake: 1726 mL  Output: 0 mL  24 Hour Net Fluid Balance: +1729 mL.     Physical Exam, Data and  Advanced Monitoring/Therapies  Neuro: Right pupil oval but reactive to light. Blind in both eyes. Heard of hearing. Responds to tactile stimulation. Withdraws weakly to pain x4. Moderate cough and gag reflex. Fentanyl gtt @ 25 mcg/hr and Propofol gtt @ 10 mcg/kg/min.   CV: Normal sinus rhythm. S1S2. BLE DP/PT pulses detectable by doppler. Bilateral radial pulses detectable by palpation. Generalized edema 1+, worse in RUE 3+. RUE fistula +bruit/+thrill. Ulceration present on lower margin of fistula, but flow is good per iHD RN able to perform the iHD run without difficulty. LUE fistula no bruit/no thrill.   Pulm: Bilateral breath sounds coarse and diminished throughout. Intubated on ACVC/R 12/Vt 450/Peep 8/FiO2 0.3. MVt 4.5-5.5. Vt ~6.5 cc/kg. Overbreathing set vent rate on occasion. Bilateral nasal Rhino Rocket packing in place, no evidence of nasal bleeding.   GI: Abdomen distended and soft, BT+x4. [place] sump gastric tube to low suction with dark brown output. Last BM 6/13.  Renal/GU: Aneuric. Undergoing iHD today. Last iHD removed 2 liters Saturday 11/27/19.  Heme/Onc: Hgb/Hct 7.8/25.3 has been stable, last checked on 11/27/19.  ID/Immuno/Rheum: Afebrile. Stable Leukocytosis 14 on 11/27/19.  Endo: Blood glucose 116-195, Received aspart insulin coverage 7 units.     Lines (site, type, placement date):  Patient Lines/Drains/Airways Status    Active LDAs     Name:   Placement date:   Placement time:   Site:   Days:    PSI Introducer 11/22/19 Left Internal jugular   11/22/19    1430       6    Peripheral IV 11/21/19 Anterior;Right External Jugular   11/21/19        External Jugular   8    Arterial Line 11/23/19 Femoral   11/23/19    0054     6    Arteriovenous Access AV Fistula Right;Upper Arm                Arteriovenous Access AV Fistula Left;Lower Arm                Pressure Injury (Ulcer) 11/28/19 Coccyx Deep Tissue Pressure Injury (ulcer)   11/28/19    0806     less than 1    Wound 11/22/19 Other (Comment)  Arm Distal;Right;Upper;Anterior   11/22/19    1731    Arm   6    GI Drain 11/21/19 Orogastric Center mouth   11/21/19         8              Activity in the last 24-hours (e.g.: ambulated, out of bed to chair, sat on edge of bed, bed-bound, etc.): Bedrest.     Advanced Therapies  (e.g.: mechanical ventilation, ECLS, CRRT, etc.)  Advanced airway  Mechanical ventilation    Labs        11/29/19  0210 11/28/19  1819 11/28/19  0409 11/27/19  0233 11/26/19  1259   PH37  --  7.41  --   --  7.45   PCO2  --  45  --   --  42   PO2  --  105  --   --  86   HCO3  --  28  --   --  29   SAO2  --  99  --   --  98   NA 132*  --  135 135  --    K 3.9  --  3.4* 3.4*  --    CL 97*  --  100* 100*  --    CO2 23  --  24 26  --    BUN 48*  --  40* 28*  --    CREAT 5.43*  --  5.00* 4.14*  --    GLU 155  --  118 168  --    CA 9.4  --  9.4 8.9  --    MG 2.1  --  2.2 2.1  --    PO4 3.8  --  3.9 3.0  --    TG  --   --  65  --   --    WBC  --   --   --  14.0*  --    HGB  --   --   --  7.8*  --    HCT  --   --   --  25.3*  --    PLT  --   --   --  304  --        Radiology Results   XR Chest 1 View (AP Portable)    Result Date: 11/26/2019  FINDINGS/IMPRESSION: Endotracheal tube and left internal jugular catheter are unchanged. Unchanged small lung volumes with retrocardiac opacity likely representing atelectasis. Clear upper  lobes. No pneumothorax. No other change. Report dictated by: Denzil Hughes, MD, signed by: Denzil Hughes, MD Department of Radiology and Biomedical Imaging     XR KUB, Flat Plate, Abdomen 1 View    Result Date: 11/29/2019  Lines/drains/medical devices: Enteric tube with tip and sidehole in the body of the stomach. Bowel gas pattern is unremarkable, nonobstructive. Report dictated by: Wanda Plump, MD, signed by: Wanda Plump, MD Department of Radiology and Biomedical Imaging      Microbiology  Microbiology results - 1 Month   Microbiology Results   Date Collected Specimen Source Result   11/23/2019  4:57  PM Central Blood Culture Central Blood  Central Blood Culture No growth 6 days.     11/23/2019  5:11 PM Peripheral Blood Culture Peripheral Blood  Peripheral Blood Culture No growth 6 days.     11/24/2019: COVID-19 RNA - not detected  11/22/2019: COVID-19 RNA - not detected      Other Studies  (e.g.: echo, cath, PFTs, etc.)  No results found.     Scheduled Meds:   0.9% sodium chloride flush  3 mL Intravenous Q8H [place]    acetaminophen  1,000 mg Feeding Tube Q8H [place]    amLODIPine  5 mg Oral Daily Frederica    atorvastatin  40 mg Feeding Tube Q PM Bernie    B-complex with vitamin C-Folic Acid  1 tablet Per OG Tube Daily [place]    heparin flush  300 Units Intravenous Daily Petros    insulin aspart  0-40 Units Subcutaneous Q4H [place] (Insulin)    lansoprazole  30 mg Feeding Tube Q AM Before Breakfast [place]    meropenem  1,000 mg Intravenous Q PM [place]    vancomycin  500 mg Intravenous After Hemodialysis    white petrolatum-mineral oil  1 Application Both Eyes 4x Daily [place]     Continuous Infusions:   dextrose 50 mL/hr (11/29/19 0600)    fentaNYL 25 mcg/hr (11/29/19 0600)    norEPINEPHrine Stopped (11/26/19 1105)    propofoL 19.9447 mcg/kg/min (11/29/19 0600)     PRN Meds:   0.9% sodium chloride flush  3 mL Intravenous PRN    dextrose  0-150 mL/hr Intravenous Continuous PRN    dextrose 50%  6.25 g Intravenous Q15 Min PRN    fentaNYL citrate  25-50 mcg Intravenous Q15 Min PRN    glucose  20 g Oral Q15 Min PRN    heparin flush  300 Units Intravenous PRN    hydrALAZINE  5 mg Intravenous Q2H PRN    labetalol  10 mg Intravenous Q2H PRN    lidocaine (PF)  5 mL Subcutaneous PRN       Code Status: DNAR/DNI      Assessment and Plan  William Sherman is a 64 y.o. 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 No OHNS or NIR intervention  possible, and patient has been transitioned to DNAR/DNRI.    Plan By System    Neuro: Right pupil oval but reactive to light. Blind in both eyes. Heard of hearing. Neurological assessment challenging obtain. Responds to tactile stimulation. Withdraws weakly to pain x4. Moderate cough and gag reflex.  # Previous strokes  - Unclear baseline neurological exam, blind, non-verbal and very hard of hearing at baseline.   - Continue Atorvastatin  for vascular risk reduction.    - Follow serial neurological exams per ICU routine.   # Sedation  - Continue sedation with Propofol infusion to optimize mechanical ventilation and tolerance of medical and nursing interventions and care.   - Target RASS zero and synchrony with mechanical ventilation.   - Low dose Fentanyl infusion for management of background pain and synergistic sedation with Propofol infusion.   - Sedation wake-ups Q Daily and PRN to assess neurological exam.   # Pain Management  - Follow pain assessments including CPOT score.   - Acetaminophen 1 gram Q 8 hours ATC as part of multi-modal pain regimen  - Acetaminophen dose not to exceed 4 grams/24 hours.   - PRN Fentanyl ordered for management of breakthrough pain.   - Offer non-pharmacological pain measures including re-positioning, warm/heat packs, etc.     CV: Normal sinus rhythm  - Continuous cardiac and hemodynamic  Monitoring.  # Hypertension  - Goal SBP < 140 mmHg and MAP > 60 mmHg.  - Started outpatient Amlodipine 5 mg PO yesterday, increased to outpatient does of 10 mg PO today.   - Started outpatient Hydralazine 50 mg PO TID today.   - PRN Labetalol IV ordered for management of SBP > 140 mmHg.   - PRN Hydralazine IV ordered for management of SBP > 140 mmHg unresponsive to PRN Labetalol.  - Plan to gradually resume outpatient anti-hypertensive medications as tolerated including Carvedilol.     Pulm: Airway compromise s/p intubation in the context of severe epistaxis.   - Continue support with mechanical  ventilation, wean FiO2 to keep SpO2 > 92%.   - Aggressive pulmonary hygiene.   - HOB elevated and ICU oral care for VAP prophylaxis.   - SBT Q Daily and PRN to evaluate readiness for extubation.   - Leave bilateral Rhino Rocket Nasal packing in place.     GI: NPO except medications and enteral feedings via small feeding tube.   # Nutrition  - Nutritional support with enteral tube feedings, currently at trophic rate given high residuals yesterday. Will follow residuals tonight and advance TF rate as tolerated.   - Follow nutritional labs.   - Appreciate Nutrition Service involvement in patient's care.     Renal/GU: End-stage-chronic-renal disease  - Appreciate Nephrology Service involvement in patient's care.   - Outpatient iHD Q Tuesday-Thursday-Saturday.   - Chemistry panels ordered to monitor for metabolic abnormalities and renal status, need for dialysis.  - Renally appropriate repletion of electrolytes PRN.     Heme: Hgb/Hct 7.8/25.3 has been stable, last checked on 11/27/19.    Onc: Nasopharyngeal necrotic mass with extension into the skull base  - [facility] Service consulting and involved in this case.  - Bilateral rhinorockets for bleeding control of epistaxis. Leave in place given plan to transition to comfort care.     ID/Immuno/Rheum: Afebrile  Osteomyelitis of skull  - Continue coverage with Vancomycin and Meropenem.    Endo: Diabetes Mellitus  - Blood glucose 116-195, Received aspart insulin coverage 7 units.   - Follow glucose checks.  - Continue glycemic management with Aspart Insulin coverage.     Disposition: iscussed disposition with William Sherman from Case Management, no option to transfer to [facility] area hospital for transition to comfort focused care. Family would like to transition to comfort care close to home in the [place] area if possible, Case Management will investigate if there are inpatient and home hospice options in the [place]. Family informed that, patient  may need to transition to  comfort focused care here at [facility]. Spoke with the patient's daughter, William Sherman today.     Vascular Access  Venous (rationale for line; place/maintain/remove): as described above.   Arterial (rationale for line; place/maintain/remove): as described above.     Mobilization  PT/OT Consult: No  Activity (e.g.: goal, restriction, brace, etc.): Bedrest    Prophylaxis  Non-pharmacologic prophylaxis against DVT/PE: SCD.   Pharmacologic prophylaxis against DVT/PE: Held in the context of bleeding.   Pharmacologic prophylaxis against stress ulcer: PPI    During this hospitalization, the patient is being treated for:  Intubated for airway protection secondary to severe epistaxis    I discussed the care of this patient with Dr. Derek Jack, Critical Care Medicine Attending Physician, the [facility] Service, bedside RN and RT.    This is an independent service.  The available consultant for this service is Francesca Oman, MD CM.    Critical Care Interventions and Management:    The following are my, Benjiman Core, NP), specific interventions and areas of active clinical management which prevented further deterioration of the patient's condition:    45 minutes of critical care time I spent with patient.    -I have monitored the patient closely at the bedside due to high risk for acute post-operative respiratory insufficiency and airway compromise.  My care included serial clinical examinations, interpretation of pulse oximetry, and laboratory data.    Benjiman Core, NP  11/29/2019  Critical Care Medicine Service

## 2019-11-29 NOTE — Progress Notes (Signed)
This is a shared service.     OTOLARYNGOLOGY - HEAD AND NECK SURGERY  History and Physical/Consult Note    Chief Complaint: high volume epistaxis    Requesting Provider: Donivan Scull MD    Requesting Service: ICU    History of Present Illness: William Sherman is a 64 y.o. 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    Review of Systems:  Unable to obtain complete ROS due to patient's acuity of condition.     --- For other relevant health data, including Allergies, Home medications, and Health history, please see addendum of note ---    Objective:  BP (!) 152/51   Pulse 74   Temp 36.9 C (98.5 F) (Axillary)   Resp 17   Ht 172.7 cm (_0 )   Wt 89 kg (196 lb 3.4 oz)   SpO2 95%   BMI 29.83 kg/m      Vent settings:  Mode: CMV (06/15 0355)  RR Set: 10 (06/15 0355)  Flow Rate Set (lpm): 36 (06/15 0355)  Tidal Volume Set: 450 mL (06/15 0355)  PEEP/CPAP Set (cm H2O): 8 cm H2O (06/15 0355)  FiO2 (%): 30 % (06/15 1000)        Intake/Output Summary (Last 24 hours) at 11/29/2019 1042  Last data filed at 11/29/2019 1000  Gross per 24 hour   Intake 1774.79 ml   Output    Net 1774.79 ml       Physical Exam:  General: Ill appearing man, intubated  Neurological:  sedated   Cardio/Pulmonary: mechanically ventilated  HEENT:      Head/Face: NCAT, no worrisome face lesions     Eyes: left pupil and iris opaque, right pupil and iris dark brown     Ears: External ears normal-appearing     Nose: External nose normal-appearing, packed bilaterally with rhinorockets     Oral cavity: 8.0 ETT in place. No active bleeding      Neck: thick wide neck, supple, flat, trachea midline, no cervical or supraclavicular lymphadenopathy      Neuro: sedated    Selected  Results:  I have reviewed the following laboratory data --        11/27/19  0233   WBC 14.0*   HCT 25.3*   PLT 304           11/29/19  0210 11/28/19  0409 11/27/19  0233   NA 132* 135 135   K 3.9 3.4* 3.4*   CL 97* 100* 100*   CO2 _1 CREAT 5.43* 5.00* 4.14*   GLU 155 118 168   MG 2.1 2.2 2.1   PO4 3.8 3.9 3.0       Lab Results   Component Value Date    WBC Count 14.0 (H) 11/27/2019    Hemoglobin 7.8 (L) 11/27/2019    Hematocrit 25.3 (L) 11/27/2019    MCV 93 11/27/2019    Platelet Count 304 11/27/2019     Lab Results   Component Value Date    NA 132 (L) 11/29/2019    K 3.9 11/29/2019    CL 97 (L) 11/29/2019    CO2 23 11/29/2019    BUN 48 (H) 11/29/2019    CREAT 5.43 (  H) 11/29/2019    GLU 155 11/29/2019     Lab Results   Component Value Date    Calcium, total, Serum / Plasma 9.4 11/29/2019    Phosphorus, Serum / Plasma 3.8 11/29/2019     Lab Results   Component Value Date    Magnesium, Serum / Plasma 2.1 11/29/2019     Lab Results   Component Value Date    Alanine transaminase 10 11/22/2019    AST 12 11/22/2019    Alkaline Phosphatase 50 11/22/2019    Bilirubin, Total 0.8 11/22/2019     Lab Results   Component Value Date    Int'l Normaliz Ratio 1.5 (H) 11/23/2019    PT 17.4 (H) 11/23/2019     Lab Results   Component Value Date    Activated Partial Thromboplastin Time 36.0 (H) 11/23/2019     No results found for: CK, MBMU, TRPI, TRIPZ  No results found for: POCTLEUK, POCTNITRITE, POCTPROTEIN, POCTPHUR, POCTRBCUR, POCTSPECGRAV, POCTKETONESU, POCTBILIUR, POCTGLUCUR      Review of Other Relevant Data:  Radiology Results   XR KUB, Flat Plate, Abdomen 1 View    Result Date: 11/29/2019  Lines/drains/medical devices: Enteric tube with tip and sidehole in the body of the stomach. Bowel gas pattern is unremarkable, nonobstructive. Report dictated by: Wanda Plump, MD, signed by: Wanda Plump, MD Department of Radiology and Biomedical Imaging      The plan regarding William Sherman was discussed with [facility]  Attending Surgeons, Drs. Caron Presume and Christeen Douglas.    Assessment and Plan:  Donny Heffern ([id number]) - 1356/1356-12  64 y.o. male with history of DM, HTN, ESRD on HD, CVA (non-verbal, blind, deaf and non-ambulatory at baseline) admitted for large volume epistaxis and found to have a right nasal mass with extension into the skull base who presents as OSH transfer following episode of high volume epistaxis requiring bilateral rhinorocket placement. 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 Per discussion with OSH [facility] doc/ID revealed that the patient likely developed a severe ear infection with worsening and invasion in the skull base and surrounding structures. Pathogens that can cause this extensive disease include Staph aureus, Pseudomonas, and Fungal infections (aspergillous, mucor). Biopsy and resection of this area is not recommended by [facility] at this time given high morbidity and mortality associated with this intervention. Given all this information family would like to pursue focus on comfort care for William Sherman. Code status changed to DNR. Will continue intubation and supportive care for now with hopes of being able to transfer him to a facility closer to family for comfort care.    Recommend  - appreciate excellent global care per primary team  -pending transfer back to OSH closer to home  - [facility] will continue to follow while in house for active bleeding. Is hemostatic today. Please alert [facility] to any acute changes in clinical status.       11/29/19    Code Status:   Full Code    --- Please page/call the Otolaryngology - Head & Neck Surgery Consult service pager with any questions ---      Additional Medical History    Medications Prior to Admission:  Scheduled Meds:   0.9% sodium chloride flush  3 mL Intravenous Q8H [place]     acetaminophen  1,000 mg Feeding Tube Q8H [place]    [START  ON 11/30/2019] amLODIPine  10 mg Oral Daily [place]    atorvastatin  40 mg Feeding Tube Q PM [place]    B-complex with vitamin C-Folic Acid  1 tablet Per OG Tube Daily [place]    heparin flush  300 Units Intravenous Daily [place]    hydrALAZINE  50 mg Oral Q8H [place]    insulin aspart  0-40 Units Subcutaneous Q4H [place] (Insulin)    lansoprazole  30 mg Feeding Tube Q AM Before Breakfast [place]    meropenem  1,000 mg Intravenous Q PM [place]    vancomycin  500 mg Intravenous After Hemodialysis    white petrolatum-mineral oil  1 Application Both Eyes 4x Daily [place]     Continuous Infusions:   dextrose 50 mL/hr (11/29/19 1000)    fentaNYL 25 mcg/hr (11/29/19 1000)    propofoL 19.9447 mcg/kg/min (11/29/19 1000)     PRN Meds:   0.9% sodium chloride flush  3 mL Intravenous PRN    dextrose  0-150 mL/hr Intravenous Continuous PRN    dextrose 50%  6.25 g Intravenous Q15 Min PRN    fentaNYL citrate  25-50 mcg Intravenous Q15 Min PRN    glucose  20 g Oral Q15 Min PRN    heparin flush  300 Units Intravenous PRN    hydrALAZINE  5 mg Intravenous Q2H PRN    labetalol  10 mg Intravenous Q2H PRN    lidocaine (PF)  5 mL Subcutaneous PRN       Allergies:  Allergies/Contraindications  No Known Allergies    Past Medical History:  No past medical history on file.    Past Surgical History:  No past surgical history on file.    Social History:  Social History     Socioeconomic History    Marital status: Married     Spouse name: Not on file    Number of children: Not on file    Years of education: Not on file    Highest education level: Not on file   Occupational History    Not on file   Tobacco Use    Smoking status: Not on file   Substance and Sexual Activity    Alcohol use: Not on file    Drug use: Not on file    Sexual activity: Not on file   Other Topics Concern    Not on file   Social History Narrative    Not on file     Social Determinants of Health     Financial Resource  Strain:     Difficulty of Paying Living Expenses:    Food Insecurity:     Worried About [place] in the Last Year:     [place] in the Last Year:    Transportation Needs:     Film/video editor (Medical):     Lack of Transportation (Non-Medical):    Physical Activity:     Days of Exercise per Week:     Minutes of Exercise per Session:    Stress:     Feeling of Stress :    Social Connections:     Frequency of Communication with Friends and Family:     Frequency of Social Gatherings with Friends and Family:     Attends Religious Services:     Active Member of Clubs or Organizations:     Attends Archivist Meetings:     Marital Status:    Intimate Partner Violence:  Fear of Current or Ex-Partner:     Emotionally Abused:     Physically Abused:     Sexually Abused:       Social history was reviewed and is non-contributory to this illness.    Family History:     Family history was reviewed and is non-contributory to this illness.

## 2019-11-29 NOTE — Interdisciplinary (Signed)
Transfer to [place]: Nursing Supervisor Jenny Reichmann) ph: [phone] to ask if there was an ICU bed available. Supervisor informed me that case management completed a formal review of the referral the day prior and declined to accept pt regardless of bed availability since there is no TBA.   - Discussed options other than transfer to OSH with ICU on-call Marland Mcalpine NP). Marland Mcalpine spoke with family who said they would discuss preferences for SNF-Hospice vs. Home Hospice  among family members.  - Referrals made to SNF-Hospice and Home Hospice via AS.   - Plan is to stop dialysis and extubate prior to transfer to hospice.

## 2019-11-29 NOTE — Nursing Note (Signed)
Pre Treatment Nursing Note  Pre treatment report received from [place], [place]  Pre treatment vitals: Wt 89 kg via Bed scale, BP 160/54, P 74  3 hours HD started in Pt Room via R-AVG QB 400 ml/min, ordered UF 1.5-2 L as tolerated. NS prime.    Medication Administration:  Aranesp: 0 mcg IVP  Zemplar: 0 mcg IVP  Blood Transfusion: No   After HD MAR reviewed and After HD MAR: the following medications are due after HD: Vanco    Post Treatment Nursing Note  Post tx vitals: Wt -2 kg, BP 127/45, P 72.   Pt tolerated 3 hr treatment with net UF 2 L fluid removed. Post treatment AVG bleeding time=15 minutes. Bruit + & Thrill +. no lidocaine. Treatment orders were not adjusted during the run.   There were no adverse reactions/complications during this procedure. Post treatment report given to: Burman Nieves, RN

## 2019-11-30 LAB — POCT GLUCOSE
Glucose, iSTAT: 138 mg/dL (ref 70–199)
Glucose, iSTAT: 159 mg/dL (ref 70–199)
Glucose, iSTAT: 77 mg/dL (ref 70–199)
Glucose, iSTAT: 129 mg/dL (ref 70–199)
Glucose, iSTAT: 124 mg/dL (ref 70–199)
Glucose, iSTAT: 126 mg/dL (ref 70–199)
Glucose, iSTAT: 127 mg/dL (ref 70–199)

## 2019-11-30 LAB — BASIC METABOLIC PANEL (NA, K,
Anion Gap: 11 (ref 4–14)
Calcium, total, Serum / Plasma: 8.9 mg/dL (ref 8.4–10.5)
Carbon Dioxide, Total: 25 mmol/L (ref 22–29)
Chloride, Serum / Plasma: 97 mmol/L — ABNORMAL LOW (ref 101–110)
Creatinine: 3.98 mg/dL — ABNORMAL HIGH (ref 0.73–1.24)
Glucose, non-fasting: 123 mg/dL (ref 70–199)
Potassium, Serum / Plasma: 3.9 mmol/L (ref 3.5–5.0)
Sodium, Serum / Plasma: 133 mmol/L — ABNORMAL LOW (ref 135–145)
Urea Nitrogen, Serum / Plasma: 29 mg/dL — ABNORMAL HIGH (ref 7–25)
eGFR - high estimate: 18 mL/min — ABNORMAL LOW (ref >59)
eGFR - low estimate: 15 mL/min — ABNORMAL LOW (ref >59)

## 2019-11-30 LAB — COMPLETE BLOOD COUNT
Hematocrit: 26.8 % — ABNORMAL LOW (ref 41.0–53.0)
Hemoglobin: 8.5 g/dL — ABNORMAL LOW (ref 13.6–17.5)
MCH: 29 pg (ref 26.0–34.0)
MCHC: 31.7 g/dL (ref 31.0–36.0)
MCV: 92 fL (ref 80–100)
Platelet Count: 268 10*9/L (ref 140–450)
RBC Count: 2.93 10*12/L — ABNORMAL LOW (ref 4.40–5.90)
WBC Count: 10.7 10*9/L — ABNORMAL HIGH (ref 3.4–10.0)

## 2019-11-30 LAB — ARTERIAL BLOOD GAS ONLY
Base excess: 6 mmol/L
Base excess: 4 mmol/L
Bicarbonate: 30 mmol/L — ABNORMAL HIGH (ref 23–29)
Bicarbonate: 28 mmol/L (ref 23–29)
FIO2: 30 % (ref 20–100)
FIO2: 30 % (ref 20–100)
Oxygen Saturation: 98 % (ref 95–100)
Oxygen Saturation: 97 % (ref 95–100)
PCO2: 44 mm Hg (ref 32–46)
PCO2: 42 mm Hg (ref 32–46)
PO2: 94 mm Hg (ref 83–108)
PO2: 87 mm Hg (ref 83–108)
pH, Blood: 7.44 (ref 7.35–7.45)
pH, Blood: 7.43 (ref 7.35–7.45)

## 2019-11-30 LAB — TRIGLYCERIDES, SERUM: Triglycerides, serum: 83 mg/dL (ref <200)

## 2019-11-30 LAB — MAGNESIUM, SERUM / PLASMA: Magnesium, Serum / Plasma: 2.1 mg/dL (ref 1.6–2.6)

## 2019-11-30 LAB — PHOSPHORUS, SERUM / PLASMA: Phosphorus, Serum / Plasma: 3.6 mg/dL (ref 2.3–4.7)

## 2019-11-30 MED ORDER — HYDRALAZINE 50 MG TABLET
50 mg | ORAL | Status: DC
  Administered 2019-11-30 – 2019-12-01 (×5): 50 mg via GASTROSTOMY

## 2019-11-30 MED ORDER — CARVEDILOL 12.5 MG TABLET
12.5 mg | ORAL | Status: DC
  Administered 2019-12-01 (×2): 12.5 mg via GASTROSTOMY

## 2019-11-30 MED ORDER — GLUCOSE 4 GRAM CHEWABLE TABLET: 4 gram | ORAL | Status: DC | PRN

## 2019-11-30 MED ORDER — HYDRALAZINE 20 MG/ML INJECTION SOLUTION
20 mg/mL | INTRAMUSCULAR | Status: AC
  Administered 2019-11-30: 17:00:00 10 mg via INTRAVENOUS

## 2019-11-30 MED ORDER — HYDRALAZINE 50 MG TABLET: 50 mg | ORAL | Status: DC

## 2019-11-30 MED ORDER — OXYCODONE 5 MG TABLET
5 mg | ORAL | Status: DC
  Administered 2019-11-30 – 2019-12-01 (×6): 10 mg via GASTROSTOMY

## 2019-11-30 MED ORDER — NICARDIPINE 40 MG/200 ML(0.2 MG/ML) IN SOD CHLOR(ISO) INTRAVENOUS SOLN
40 mg/200 mL (0.2 mg/mL) | INTRAVENOUS | Status: DC
  Administered 2019-11-30: 20:00:00 5 mg/h via INTRAVENOUS
  Administered 2019-12-01 (×4): 10 mg/h via INTRAVENOUS
  Administered 2019-12-01: 01:00:00 7.5 mg/h via INTRAVENOUS
  Administered 2019-12-01: 22:00:00 12.5 mg/h via INTRAVENOUS
  Administered 2019-12-02: 01:00:00 13 mg/h via INTRAVENOUS
  Administered 2019-12-02: 07:00:00 10 mg/h via INTRAVENOUS
  Administered 2019-12-02: 04:00:00 13 mg/h via INTRAVENOUS
  Administered 2019-12-02: 10:00:00 10 mg/h via INTRAVENOUS
  Administered 2019-12-02: 13:00:00 15 mg/h via INTRAVENOUS

## 2019-11-30 MED ORDER — CARVEDILOL 12.5 MG TABLET
12.5 mg | ORAL | Status: DC
  Administered 2019-11-30: 17:00:00 12.5 mg via ORAL

## 2019-11-30 MED ORDER — CLONIDINE 0.2 MG/24 HR WEEKLY TRANSDERMAL PATCH: 0.2 mg/24 hr | TRANSDERMAL | Status: DC

## 2019-11-30 MED ORDER — OXYCODONE 5 MG TABLET
5 mg | ORAL | Status: DC
  Administered 2019-11-30: 17:00:00 10 mg via ORAL

## 2019-11-30 MED ORDER — NICARDIPINE 40 MG/200 ML(0.2 MG/ML) IN SOD CHLOR(ISO) INTRAVENOUS SOLN: 40 mg/200 mL (0.2 mg/mL) | INTRAVENOUS | Status: DC

## 2019-11-30 MED FILL — CARVEDILOL 12.5 MG TABLET: 12.5 mg | ORAL | Qty: 1

## 2019-11-30 MED FILL — HYDRALAZINE 50 MG TABLET: 50 mg | ORAL | Qty: 1

## 2019-11-30 MED FILL — FENTANYL (PF) 50 MCG/ML INJECTION SOLUTION: 50 mcg/mL | INTRAMUSCULAR | Qty: 2

## 2019-11-30 MED FILL — DIPRIVAN 10 MG/ML INTRAVENOUS EMULSION: 10 mg/mL | INTRAVENOUS | Qty: 100

## 2019-11-30 MED FILL — OXYCODONE 5 MG TABLET: 5 mg | ORAL | Qty: 2

## 2019-11-30 MED FILL — LANSOPRAZOLE 30 MG DELAYED RELEASE,DISINTEGRATING TABLET: 30 mg | ORAL | Qty: 1

## 2019-11-30 MED FILL — ATORVASTATIN 40 MG TABLET: 40 mg | ORAL | Qty: 1

## 2019-11-30 MED FILL — ACETAMINOPHEN 500 MG TABLET: 500 mg | ORAL | Qty: 2

## 2019-11-30 MED FILL — NEPHRO-VITE 0.8 MG TABLET: 0.8 mg | ORAL | Qty: 1

## 2019-11-30 MED FILL — HYDRALAZINE 20 MG/ML INJECTION SOLUTION: 20 mg/mL | INTRAMUSCULAR | Qty: 1

## 2019-11-30 MED FILL — MEROPENEM 1 GRAM INTRAVENOUS SOLUTION: 1 gram | INTRAVENOUS | Qty: 1000

## 2019-11-30 MED FILL — DILAUDID (PF) 0.5 MG/0.5 ML INJECTION SYRINGE: 0.5 mg/ mL | INTRAMUSCULAR | Qty: 0.5

## 2019-11-30 MED FILL — LABETALOL 5 MG/ML INTRAVENOUS SOLUTION: 5 mg/mL | INTRAVENOUS | Qty: 20

## 2019-11-30 MED FILL — AMLODIPINE 10 MG TABLET: 10 mg | ORAL | Qty: 1

## 2019-11-30 MED FILL — CARDENE 40 MG/200 ML(0.2 MG/ML) IN SOD CHLOR(ISO-OSM) INTRAVENOUS SOLN: 40 mg/200 mL (0.2 mg/mL) | INTRAVENOUS | Qty: 200

## 2019-11-30 MED FILL — FENTANYL (PF) 10 MCG/ML IN 0.9 % SODIUM CHLORIDE INTRAVENOUS: 2500 mcg/250 mL (10 mcg/mL) | INTRAVENOUS | Qty: 250

## 2019-11-30 MED FILL — HEPARIN LOCK FLUSH (PORCINE) (PF) 100 UNIT/ML INTRAVENOUS SYRINGE: 100 unit/mL | INTRAVENOUS | Qty: 3

## 2019-11-30 NOTE — Consults (Signed)
NEPHROLOGY FOLLOW UP CONSULT NOTE     Events  continued hypertension; receiving IV labetalol and hydralazine       Subjective  Intubated/sedated    Review of Systems    Review of systems unobtainable (pt intubated)    Vitals  Heart Rate:  [66-84] 78  *Resp:  [7-21] 14  Arterial Line BP (mmHg) : (129-172)/(41-57) 129/46  FiO2 (%):  [30 %] 30 %  SpO2:  [93 %-99 %] 95 %    Most Recent Weight: 91.5 kg (201 lb 11.5 oz)  Admission Weight: (S) 84.4 kg (186 lb 1.1 oz)      Intake/Output Summary (Last 24 hours) at 11/30/2019 2348  Last data filed at 11/30/2019 2300  Gross per 24 hour   Intake 1917.48 ml   Output 450 ml   Net 1467.48 ml       Current IP Medications  Scheduled Meds:   0.9% sodium chloride flush  3 mL Intravenous Q8H [place]    acetaminophen  1,000 mg Feeding Tube Q8H [place]    amLODIPine  10 mg Feeding Tube Daily [place]    atorvastatin  40 mg Feeding Tube Q PM [place]    B-complex with vitamin C-Folic Acid  1 tablet Per OG Tube Daily [place]    carvediloL  12.5 mg Feeding Tube BID SCH    heparin flush  300 Units Intravenous Daily [place]    hydrALAZINE  50 mg Feeding Tube Q6H [place]    insulin aspart  0-40 Units Subcutaneous Q4H SCH (Insulin)    lansoprazole  30 mg Feeding Tube Q AM Before Breakfast [place]    meropenem  1,000 mg Intravenous Q PM [place]    oxyCODONE  10 mg Feeding Tube Q4H [place]    vancomycin  500 mg Intravenous After Hemodialysis    white petrolatum-mineral oil  1 Application Both Eyes 4x Daily [place]     Continuous Infusions:   dextrose 50 mL/hr (11/30/19 2309)    fentaNYL Stopped (11/30/19 0509)    niCARdipine 10 mg/hr (11/30/19 2300)    propofoL Stopped (11/30/19 1245)     PRN Meds:   0.9% sodium chloride flush  3 mL Intravenous PRN    dextrose  0-150 mL/hr Intravenous Continuous PRN    dextrose 50%  6.25 g Intravenous Q15 Min PRN    fentaNYL citrate  25-50 mcg Intravenous Q15 Min PRN    glucose  20 g Feeding Tube Q15 Min PRN    heparin flush  300 Units Intravenous PRN    hydrALAZINE  10 mg Intravenous  Q2H PRN    labetalol  10 mg Intravenous Q2H PRN    lidocaine (PF)  5 mL Subcutaneous PRN       Physical Exam  General: critically ill, sedated  Eyes: normal conjunctivae, anicteric sclerae  ENMT: normal external nose and ears; mucous membranes moist; ETT in place  Respiratory: coarse upper airway sounds  Cardiovascular: regular rate & rhythm; normal s1/s2; no rub  Gasterointestional: soft abdomen, non-tender, non-distended, positive active bowel sounds  Musculoskeletal: no clubbing/cyanosis, 2+ edema  Skin/Integumentary: no obvious rash  Neurologic: sedated  Psychiatric: sedated  Heme/lymphatic: no excessive bruising or bleeding  Dialysis access: RAVF    Data  All labs available in last 3 days in [facility] reviewed.          11/30/19  1416 11/30/19  1051 11/30/19  0305 11/30/19  0304 11/29/19  0210 11/28/19  1819 11/28/19  0409   WBC 10.7*  --   --   --   --   --   --  HCT 26.8*  --   --   --   --   --   --    PLT 268  --   --   --   --   --   --    NA  --   --  133*  --  132*  --  135   K  --   --  3.9  --  3.9  --  3.4*   CL  --   --  97*  --  97*  --  100*   CO2  --   --  25  --  23  --  24   BUN  --   --  29*  --  48*  --  40*   CREAT  --   --  3.98*  --  5.43*  --  5.00*   GLU  --   --  123  --  155  --  118   CA  --   --  8.9  --  9.4  --  9.4   MG  --   --  2.1  --  2.1  --  2.2   PO4  --   --  3.6  --  3.8  --  3.9   PH37  --  7.43  --  7.44  --  7.41  --    PCO2  --  42  --  44  --  45  --    PO2  --  87  --  94  --  105  --            Assessment and Recommendations  William Sherman is a 64 y.o. male [facility] HD TTS via AVF, diabetes, prior stroke,w/ right nasopharyngeal tumor who presents after high volume epistaxis with hemoglobin drop and requiring intubation for airway protection. Nonsurgical candidate, awaiting transfer back to a hospital closer to family's home prior to transitioning to hospice care.    End stage renal disease  Dialysis  -Maintenance dialysis TTS  -Volume status: volume overload  -Access:     Blood Pressure  Hypertensive - on IV labetalol and hydralazine; recommend starting lisinopril 10 mg daily    Sodium  Sodium, Serum / Plasma   Date Value Ref Range Status   11/30/2019 133 (L) 135 - 145 mmol/L Final   restrict free water to 1 L daily    Potassium  Potassium, Serum / Plasma   Date Value Ref Range Status   11/30/2019 3.9 3.5 - 5.0 mmol/L Final   satisfactory    Acid-Base  Carbon Dioxide, Total   Date Value Ref Range Status   11/30/2019 25 22 - 29 mmol/L Final   satisfactory    Anemia  Hemoglobin   Date Value Ref Range Status   11/30/2019 8.5 (L) 13.6 - 17.5 g/dL Final   adequate; recommend continuing Aranesp 100 g IV weekly    Mineral Metabolism  Phosphorus, Serum / Plasma   Date Value Ref Range Status   11/30/2019 3.6 2.3 - 4.7 mg/dL Final   satisfactory    Calcium, total, Serum / Plasma   Date Value Ref Range Status   11/30/2019 8.9 8.4 - 10.5 mg/dL Final   satisfactory    Nutrition  Albumin, Serum / Plasma   Date Value Ref Range Status   11/22/2019 2.1 (L) 3.4 - 4.8 g/dL Final   goal for protein intake should be at least 1.2 g/kg per day  He has a renal vitamin prescribed    Drug dosing  All medications reviewed    Medication dosing for ESRD consideration  - Dose medications per guidelines for patients receiving hemodialysis.   - Pain control: Avoid morphine, Demerol. Preferable to use oxycodone,hydromorphone or fentanyl. Monitor closely for signs of opiate toxicity.   - Baclofen is contraindicated  - Cautious use of Gabapentin - max 320m/day.   - GERD: Avoid maalox, sucralfate and famotidine  - Constipation: Avoid Fleets enema. Okay to use tap water enema.       SJarome Matin GDocia FurlMD  11/30/19  Pager [phone]

## 2019-11-30 NOTE — Progress Notes (Signed)
OTOLARYNGOLOGY - HEAD AND NECK SURGERY  History and Physical/Consult Note    Chief Complaint: high volume epistaxis    Requesting Provider: Donivan Scull MD    Requesting Service: ICU    History of Present Illness: William Sherman is a 64 y.o. 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    Review of Systems:  Unable to obtain complete ROS due to patient's acuity of condition.     --- For other relevant health data, including Allergies, Home medications, and Health history, please see addendum of note ---    Objective:  BP (!) 127/45 (Patient Position: Lying)   Pulse 69   Temp 36.3 C (97.3 F)   Resp (!) 11   Ht 172.7 cm (_0 )   Wt 91.5 kg (201 lb 11.5 oz)   SpO2 96%   BMI 30.67 kg/m      Vent settings:  Mode: PSV (06/16 0540)  RR Set: 10 (06/16 0405)  Flow Rate Set (lpm): 27 (06/16 0405)  Tidal Volume Set: 450 mL (06/16 0405)  PEEP/CPAP Set (cm H2O): 8 cm H2O (06/16 0540)  FiO2 (%): 30 % (06/16 0900)  Pressure Support: 5 cmH20 (06/16 0540)        Intake/Output Summary (Last 24 hours) at 11/30/2019 1007  Last data filed at 11/30/2019 0900  Gross per 24 hour   Intake 2025.68 ml   Output 2800 ml   Net -774.32 ml       Physical Exam:  General: Ill appearing man, intubated  Neurological:  sedated   Cardio/Pulmonary: mechanically ventilated  HEENT:      Head/Face: NCAT, no worrisome face lesions     Eyes: left pupil and iris opaque, right pupil and iris dark brown     Ears: External ears normal-appearing     Nose: External nose normal-appearing, packed bilaterally with rhinorockets     Oral cavity: 8.0 ETT in place. No active bleeding      Neck: thick wide neck, supple, flat, trachea midline, no cervical or supraclavicular  lymphadenopathy      Neuro: sedated    Selected Results:  I have reviewed the following laboratory data --  No results found in last 72 hours        11/30/19  0305 11/29/19  0210 11/28/19  0409   NA 133* 132* 135   K 3.9 3.9 3.4*   CL 97* 97* 100*   CO2 _1 CREAT 3.98* 5.43* 5.00*   GLU 123 155 118   MG 2.1 2.1 2.2   PO4 3.6 3.8 3.9       Lab Results   Component Value Date    WBC Count 14.0 (H) 11/27/2019    Hemoglobin 7.8 (L) 11/27/2019    Hematocrit 25.3 (L) 11/27/2019    MCV 93 11/27/2019    Platelet Count 304 11/27/2019     Lab Results   Component Value Date    NA 133 (L) 11/30/2019    K 3.9 11/30/2019    CL 97 (L) 11/30/2019    CO2 25 11/30/2019    BUN 29 (H) 11/30/2019    CREAT 3.98 (H) 11/30/2019    GLU 123 11/30/2019     Lab Results  Component Value Date    Calcium, total, Serum / Plasma 8.9 11/30/2019    Phosphorus, Serum / Plasma 3.6 11/30/2019     Lab Results   Component Value Date    Magnesium, Serum / Plasma 2.1 11/30/2019     Lab Results   Component Value Date    Alanine transaminase 10 11/22/2019    AST 12 11/22/2019    Alkaline Phosphatase 50 11/22/2019    Bilirubin, Total 0.8 11/22/2019     Lab Results   Component Value Date    Int'l Normaliz Ratio 1.5 (H) 11/23/2019    PT 17.4 (H) 11/23/2019     Lab Results   Component Value Date    Activated Partial Thromboplastin Time 36.0 (H) 11/23/2019     No results found for: CK, MBMU, TRPI, TRIPZ  No results found for: POCTLEUK, POCTNITRITE, POCTPROTEIN, POCTPHUR, POCTRBCUR, POCTSPECGRAV, POCTKETONESU, POCTBILIUR, POCTGLUCUR      Review of Other Relevant Data:  Radiology Results  No results found.    The plan regarding William Sherman was discussed with [facility] Attending Surgeons, Drs. Caron Presume and Christeen Douglas.    Assessment and Plan:  William Sherman ([id number]) - [id number]  64 y.o. 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 Per discussion with OSH [facility] doc/ID revealed that the patient likely developed a severe ear infection with worsening and invasion in the skull base and surrounding structures. Pathogens that can cause this extensive disease include Staph aureus, Pseudomonas, and Fungal infections (aspergillous, mucor). Biopsy and resection of this area is not recommended by [facility] at this time given high morbidity and mortality associated with this intervention. Given all this information family would like to pursue focus on comfort care for Mr. Castille. Code status changed to DNR. Will continue intubation and supportive care for now with hopes of being able to transfer him to a facility closer to family for comfort care.    Recommend  - appreciate excellent global care per primary team  - pending transfer back to OSH closer to home; appreciate CM and SW assistance with these logistics  - [facility] will continue to follow while in house for active bleeding. Remains hemostatic today. Please alert [facility] to any acute changes in clinical status.     Juanetta Snow MD  Resident Physician, PGY-2  Otolaryngology - Head & Neck Surgery  11/30/19    Code Status:   DNR/DNI    --- Please page/call the Otolaryngology - Head & Neck Surgery Consult service pager with any questions ---      Additional Medical History    Medications Prior to Admission:  Scheduled Meds:   0.9% sodium chloride flush  3 mL Intravenous Q8H [place]    acetaminophen  1,000 mg Feeding Tube Q8H [place]    amLODIPine  10 mg Feeding Tube Daily [place]    atorvastatin  40 mg Feeding Tube Q PM [place]    B-complex with vitamin  C-Folic Acid  1 tablet Per OG Tube Daily [place]    carvediloL  12.5 mg Feeding Tube BID [facility]  heparin flush  300 Units Intravenous Daily [place]    hydrALAZINE  50 mg Feeding Tube Q6H [place]    insulin aspart  0-40 Units Subcutaneous Q4H SCH (Insulin)    lansoprazole  30 mg Feeding Tube Q AM Before Breakfast [place]    meropenem  1,000 mg Intravenous Q PM [place]    oxyCODONE  10 mg Feeding Tube Q4H [place]    vancomycin  500 mg Intravenous After Hemodialysis    white petrolatum-mineral oil  1 Application Both Eyes 4x Daily [place]     Continuous Infusions:   dextrose 50 mL/hr (11/30/19 0900)    fentaNYL Stopped (11/30/19 0509)    propofoL 30.0158 mcg/kg/min (11/30/19 0900)     PRN Meds:   0.9% sodium chloride flush  3 mL Intravenous PRN    dextrose  0-150 mL/hr Intravenous Continuous PRN    dextrose 50%  6.25 g Intravenous Q15 Min PRN    fentaNYL citrate  25-50 mcg Intravenous Q15 Min PRN    glucose  20 g Feeding Tube Q15 Min PRN    heparin flush  300 Units Intravenous PRN    hydrALAZINE  10 mg Intravenous Q2H PRN    labetalol  10 mg Intravenous Q2H PRN    lidocaine (PF)  5 mL Subcutaneous PRN       Allergies:  Allergies/Contraindications  No Known Allergies    Past Medical History:  No past medical history on file.    Past Surgical History:  No past surgical history on file.    Social History:  Social History     Socioeconomic History    Marital status: Married     Spouse name: Not on file    Number of children: Not on file    Years of education: Not on file    Highest education level: Not on file   Occupational History    Not on file   Tobacco Use    Smoking status: Not on file   Substance and Sexual Activity    Alcohol use: Not on file    Drug use: Not on file    Sexual activity: Not on file   Other Topics Concern    Not on file   Social History Narrative    Not on file     Social Determinants of Health     Financial Resource Strain:     Difficulty of Paying Living Expenses:    Food Insecurity:      Worried About [place] in the Last Year:     [place] in the Last Year:    Transportation Needs:     Film/video editor (Medical):     Lack of Transportation (Non-Medical):    Physical Activity:     Days of Exercise per Week:     Minutes of Exercise per Session:    Stress:     Feeling of Stress :    Social Connections:     Frequency of Communication with Friends and Family:     Frequency of Social Gatherings with Friends and Family:     Attends Religious Services:     Active Member of Clubs or Organizations:     Attends Archivist Meetings:     Marital Status:    Intimate Partner Violence:     Fear of Current or Ex-Partner:     Emotionally Abused:     Physically Abused:     Sexually Abused:  Social history was reviewed and is non-contributory to this illness.    Family History:     Family history was reviewed and is non-contributory to this illness.

## 2019-11-30 NOTE — Progress Notes (Signed)
CRITICAL CARE PROGRESS NOTE       24 Hour Events:  - Ongoing hypertensive to SBP 160's in setting of fentanyl gtt off -- requiring PRN labetalol and hydralazine    Subjective:  - Intubated, sedated     Medications:   Scheduled Meds:   0.9% sodium chloride flush  3 mL Intravenous Q8H [place]    acetaminophen  1,000 mg Feeding Tube Q8H [place]    amLODIPine  10 mg Feeding Tube Daily [place]    atorvastatin  40 mg Feeding Tube Q PM [place]    B-complex with vitamin C-Folic Acid  1 tablet Per OG Tube Daily [place]    carvediloL  12.5 mg Oral BID [place]    cloNIDine  1 patch Transdermal Q7 Days SCH    heparin flush  300 Units Intravenous Daily [place]    hydrALAZINE  10 mg Intravenous Once    hydrALAZINE  50 mg Oral Q8H [place]    insulin aspart  0-40 Units Subcutaneous Q4H SCH (Insulin)    lansoprazole  30 mg Feeding Tube Q AM Before Breakfast [place]    meropenem  1,000 mg Intravenous Q PM [place]    vancomycin  500 mg Intravenous After Hemodialysis    white petrolatum-mineral oil  1 Application Both Eyes 4x Daily [place]     Continuous Infusions:   dextrose 50 mL/hr (11/30/19 0600)    fentaNYL Stopped (11/30/19 0509)    propofoL 30 mcg/kg/min (11/30/19 0622)     PRN Meds:    0.9% sodium chloride flush  3 mL Intravenous PRN    dextrose  0-150 mL/hr Intravenous Continuous PRN    dextrose 50%  6.25 g Intravenous Q15 Min PRN    fentaNYL citrate  25-50 mcg Intravenous Q15 Min PRN    glucose  20 g Oral Q15 Min PRN    heparin flush  300 Units Intravenous PRN    hydrALAZINE  10 mg Intravenous Q2H PRN    labetalol  10 mg Intravenous Q2H PRN    lidocaine (PF)  5 mL Subcutaneous PRN       NEUROLOGIC  Exam: Sedated  CPOT: Total  Min: 0  Max: 3  Pain meds:   - Tylenol 1,000 mg q8h [place]  - Fentanyl gtt off  - Fentanyl PRN total 550 mcg /24 hours   RASS: Moderate sedation, Any movement (but no eye contact) to voice  Sedation meds:   - Propofol @ 30 mcg   CAM-ICU: Unable to Assess; RASS -4 or -5    CARDIOVASCULAR  Pulse  Min: 69  Max: 75  Most  recent: 69  BP  Min: 117/41  Max: 160/54  Most recent: (!) 127/45; No data recorded  Most recent: 71 mmHg  Vasoactives: None, labetalol and hydralazine PRN for SBP < 140   Exam: RRR    No results found in last 72 hours    Temp:  [36.3 C (97.3 F)-36.5 C (97.7 F)] 36.3 C (97.3 F)  Heart Rate:  [69-75] 69  BP: (117-160)/(40-54) 127/45  Arterial Line BP (mmHg) : (122-172)/(39-57) 172/57  *Resp:  [10-21] 14  FiO2 (%):  [30 %] 30 %  SpO2:  [95 %-99 %] 98 %  O2 Device: Ventilator    RESPIRATORY:  Resp  Min: 10  Max: 21  Most recent: 14  SpO2  Min: 95 %  Max: 99 %  Most recent: 98 %  Exam: Intubated, mechanically ventilated   Oxygen/ventilatory therapy:  Mode: PSV (06/16 0540)  RR  Set: 10 (06/16 0405)  Flow Rate Set (lpm): 27 (06/16 0405)  Tidal Volume Set: 450 mL (06/16 0405)  PEEP/CPAP Set (cm H2O): 8 cm H2O (06/16 0540)  FiO2 (%): 30 % (06/16 0800)  Pressure Support: 5 cmH20 (06/16 0540)  Recent Labs     11/30/19  0304 11/28/19  1819   PH37 7.44 7.41   PCO2 44 45   PO2 94 105   HCO3 30* 28   SAO2 98 99   FIO2 30.00 30.00     CXR:   No results found.    GASTROINTESTINAL  Exam: Soft, nontender, non distended  Diet: TF goal off     OGT 400 cc / 24 hours     No results for input(s): TBILI, DBILI, AST, ALT, ALKP, GGT, ALB in the last 72 hours.    RENAL  Recent Labs     11/30/19  0305 11/29/19  0210 11/28/19  0409   NA 133* 132* 135   K 3.9 3.9 3.4*   CL 97* 97* 100*   CO2 _0 BUN 29* 48* 40*   CREAT 3.98* 5.43* 5.00*   GLU 123 155 118   CA 8.9 9.4 9.4   MG 2.1 2.1 2.2   PO4 3.6 3.8 3.9     Fluids (last 24 hours):    Intake/Output Summary (Last 24 hours) at 11/30/2019 0833  Last data filed at 11/30/2019 0600  Gross per 24 hour   Intake 2203.15 ml   Output 2800 ml   Net -596.85 ml     Last HD Tuesday     HEMATOLOGY  No results for input(s): HGB, HCT, PLT, INR, PTT, FIB in the last 72 hours.  Blood products (last 24 hours): None  Anticoagulation: None    INFECTIOUS DISEASE/IMMUNOLOGIC  Temp  Min: 36.3 C (97.3 F)   Max: 36.5 C (97.7 F)  Most recent: 36.3 C (97.3 F)  No results for input(s): WBC, NEUTA in the last 72 hours.  Microbiology results:  Microbiology Results (last 72 hours)     Procedure Component Value Units Date/Time    Central Blood Culture [[id number]] Collected: 11/23/19 1657    Order Status: Completed Specimen: Central Blood Updated: 11/29/19 0717     Central Blood Culture No growth 6 days.    Peripheral Blood Culture [[id number]] Collected: 11/23/19 1711    Order Status: Completed Specimen: Peripheral Blood Updated: 11/29/19 0715     Peripheral Blood Culture No growth 6 days.        Antimicrobials: Meropenem, vancomycin (dialysis dosing)    ENDOCRINE  Blood Glucose (mg/dL)- manual entry  Min: 102  Max: 166  Most recent: 124  Recent Labs     11/30/19  0305 11/29/19  0210 11/28/19  0409   GLU 123 155 118     Insulin orders: ISS    Imaging within last 24 hours:  No results found.    Pending Work-up:  Pending Labs     Order Current Status    Arterial Blood Gas only Collected (11/30/19 0305)    Add On Differential In process          ASSESSMENT/PLAN:  William Sherman is a 64 y.o. 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 No ENT or NIR intervention possible, and patient has been transitioned to DNAR/DNI with plans to transfer  to hospital closer to patient's family or home hospice.       NEURO:  # Pain  - APAP  - Fentanyl PRN  - [facility] 63m [facility]h [facility]   # Sedation  - Propofol    CV:   # SBP < 140 in setting of internal carotid bleeding  # HTN  - Labetalol and hydralazine PRN   - Amlopidine 10 mg every day   - Restart Carvedilol 12.5 mg BID  - [facility]dralazine [facility]   RESP:   # Intubated for airway protection  - SBT  - Consider extubation if patient does well on SBT    GI:   # NPO  - restart TF after extubation     RENAL:   # ESRD on iHD  - Nephrology  following, T/Th/Sa schedule      HEME:   # Ongoing bleeding  - ENT packing in place   - Monitor CBC     ID:   # Skull bas osteomyelitis  - Vanc, meropenem    ENDO:   # DMT2  - ISS    ICU BUNDLE  Code status: Code Status: DNAR/DNI      Patient Lines/Drains/Airways Status    Active LDAs     Name:   Placement date:   Placement time:   Site:   Days:    PSI Introducer 11/22/19 Left Internal jugular   11/22/19    1430       7    Peripheral IV 11/21/19 Anterior;Right External Jugular   11/21/19        External Jugular   9    ETT  Single Cuffed 8.0 mm   11/21/19    1600     8    Arterial Line 11/23/19 Femoral   11/23/19    0054     7    Arteriovenous Access AV Fistula Right;Upper Arm                Arteriovenous Access AV Fistula Left;Lower Arm                Pressure Injury (Ulcer) 11/28/19 Coccyx Deep Tissue Pressure Injury (ulcer)   11/28/19    0806     2    Wound 11/22/19 Other (Comment) Arm Distal;Right;Upper;Anterior   11/22/19    1731    Arm   7    GI Drain 11/21/19 Orogastric Center mouth   11/21/19         9                PT/OT: No  DVT prophylaxis: None  Stress Ulcer prophylaxis: Pepcid  Diet: NPO, TF    Bowel regimen: None  Dispo: ICU     I discussed the care of this patient with ICU attending and fellow.     Please contact the Critical Care Service with questions:  13 ICU: ph [phone], pgr [phone]   SElias Else MD  11/30/19  Critical Care Medicine Service

## 2019-12-01 DIAGNOSIS — D649 Anemia, unspecified: Secondary | ICD-10-CM

## 2019-12-01 DIAGNOSIS — E871 Hypo-osmolality and hyponatremia: Secondary | ICD-10-CM

## 2019-12-01 LAB — BASIC METABOLIC PANEL (NA, K,
Anion Gap: 11 (ref 4–14)
Calcium, total, Serum / Plasma: 8.8 mg/dL (ref 8.4–10.5)
Carbon Dioxide, Total: 23 mmol/L (ref 22–29)
Chloride, Serum / Plasma: 95 mmol/L — ABNORMAL LOW (ref 101–110)
Creatinine: 4.97 mg/dL — ABNORMAL HIGH (ref 0.73–1.24)
Glucose, non-fasting: 117 mg/dL (ref 70–199)
Potassium, Serum / Plasma: 4.1 mmol/L (ref 3.5–5.0)
Sodium, Serum / Plasma: 129 mmol/L — ABNORMAL LOW (ref 135–145)
Urea Nitrogen, Serum / Plasma: 38 mg/dL — ABNORMAL HIGH (ref 7–25)
eGFR - high estimate: 13 mL/min — ABNORMAL LOW (ref >59)
eGFR - low estimate: 12 mL/min — ABNORMAL LOW (ref >59)

## 2019-12-01 LAB — POCT GLUCOSE
Glucose, iSTAT: 100 mg/dL (ref 70–199)
Glucose, iSTAT: 103 mg/dL (ref 70–199)
Glucose, iSTAT: 116 mg/dL (ref 70–199)
Glucose, iSTAT: 117 mg/dL (ref 70–199)
Glucose, iSTAT: 122 mg/dL (ref 70–199)
Glucose, iSTAT: 127 mg/dL (ref 70–199)
Glucose, iSTAT: 76 mg/dL (ref 70–199)

## 2019-12-01 LAB — ARTERIAL BLOOD GAS ONLY
Base excess: 3 mmol/L
Bicarbonate: 27 mmol/L (ref 23–29)
FIO2: 30 % (ref 20–100)
Oxygen Saturation: 98 % (ref 95–100)
PCO2: 40 mm Hg (ref 32–46)
PO2: 91 mm Hg (ref 83–108)
pH, Blood: 7.45 (ref 7.35–7.45)

## 2019-12-01 LAB — PHOSPHORUS, SERUM / PLASMA: Phosphorus, Serum / Plasma: 4.8 mg/dL — ABNORMAL HIGH (ref 2.3–4.7)

## 2019-12-01 LAB — MAGNESIUM, SERUM / PLASMA: Magnesium, Serum / Plasma: 2 mg/dL (ref 1.6–2.6)

## 2019-12-01 MED ORDER — SODIUM CHLORIDE 0.9 % IV BOLUS
0.9 | INTRAVENOUS | Status: DC | PRN
Start: 2019-12-01 — End: 2019-12-01

## 2019-12-01 MED ORDER — LIDOCAINE (PF) 10 MG/ML (1 %) INJECTION SOLUTION
10 | INTRAMUSCULAR | Status: DC | PRN
Start: 2019-12-01 — End: 2019-12-01

## 2019-12-01 MED ORDER — SODIUM CHLORIDE 0.9 % DIALYSIS CIRCUIT
0.9 | Freq: Once | INTRAVENOUS | Status: AC
Start: 2019-12-01 — End: 2019-12-01
  Administered 2019-12-01: 19:00:00 200 mL via INTRAVENOUS

## 2019-12-01 MED ORDER — LIDOCAINE 4 % TOPICAL CREAM
4 | Freq: Once | TOPICAL | Status: DC | PRN
Start: 2019-12-01 — End: 2019-12-01

## 2019-12-01 MED ORDER — SODIUM CHLORIDE 0.9 % DIALYSIS CIRCUIT
0.9 | Freq: Once | INTRAVENOUS | Status: AC
Start: 2019-12-01 — End: 2019-12-01
  Administered 2019-12-01: 16:00:00 200 mL via INTRAVENOUS

## 2019-12-01 MED ORDER — HYDROMORPHONE (PF) 0.5 MG/0.5 ML INJECTION SYRINGE
0.5 mg/0.5 mL | INTRAMUSCULAR | Status: DC | PRN
  Administered 2019-12-02 (×3): 0.2 mg via INTRAVENOUS

## 2019-12-01 MED ORDER — SODIUM CHLORIDE 0.9 % DIALYSIS CIRCUIT
0.9 | INTRAVENOUS | Status: DC | PRN
Start: 2019-12-01 — End: 2019-12-01

## 2019-12-01 MED FILL — DEXTROSE 50 % IN WATER (D50W) INTRAVENOUS SYRINGE: INTRAVENOUS | Qty: 50

## 2019-12-01 MED FILL — FENTANYL (PF) 50 MCG/ML INJECTION SOLUTION: 50 mcg/mL | INTRAMUSCULAR | Qty: 2

## 2019-12-01 MED FILL — CARVEDILOL 12.5 MG TABLET: 12.5 mg | ORAL | Qty: 1

## 2019-12-01 MED FILL — HYDRALAZINE 50 MG TABLET: 50 mg | ORAL | Qty: 1

## 2019-12-01 MED FILL — CARDENE 40 MG/200 ML(0.2 MG/ML) IN SOD CHLOR(ISO-OSM) INTRAVENOUS SOLN: 40 mg/200 mL (0.2 mg/mL) | INTRAVENOUS | Qty: 200

## 2019-12-01 MED FILL — MEROPENEM 1 GRAM INTRAVENOUS SOLUTION: 1 gram | INTRAVENOUS | Qty: 1000

## 2019-12-01 MED FILL — ACETAMINOPHEN 500 MG TABLET: 500 mg | ORAL | Qty: 2

## 2019-12-01 MED FILL — ATORVASTATIN 40 MG TABLET: 40 mg | ORAL | Qty: 1

## 2019-12-01 MED FILL — LANSOPRAZOLE 30 MG DELAYED RELEASE,DISINTEGRATING TABLET: 30 mg | ORAL | Qty: 1

## 2019-12-01 MED FILL — VANCOMYCIN 500 MG INTRAVENOUS SOLUTION: 500 mg | INTRAVENOUS | Qty: 10

## 2019-12-01 MED FILL — AMLODIPINE 10 MG TABLET: 10 mg | ORAL | Qty: 1

## 2019-12-01 MED FILL — NEPHRO-VITE 0.8 MG TABLET: 0.8 mg | ORAL | Qty: 1

## 2019-12-01 MED FILL — OXYCODONE 5 MG TABLET: 5 mg | ORAL | Qty: 2

## 2019-12-01 MED FILL — XYLOCAINE-MPF 10 MG/ML (1 %) INJECTION SOLUTION: 10 mg/mL (1 %) | INTRAMUSCULAR | Qty: 2

## 2019-12-01 NOTE — Plan of Care (Signed)
Problem: Electrolyte Imbalance - Apheresis Hemodialysis Patient - Adult  Goal: Dialyzed with appropriate bath based on lab values  Note: Dialyze with 3 K, 2.5 Ca acid bath for 3 hours. Pre treatment K 4.1 mmol/L

## 2019-12-01 NOTE — Interdisciplinary (Signed)
Hospice Placement                Spoke with pt's daughter Basilia Jumbo via phone this afternoon to discuss Home Hospice vs. SNF-Hospice placement. Informed her that there were no accepting SNFs due to lack of bed availability or no contract with insurance. Rosa stated that family preferred SNF but was agreeable to home hospice rather than [facility]. Informed Rosa that the home care agency that had seen pt back in April was able to provide home hospice care and that pt could be transferred home tomorrow.               I informed Basilia Jumbo that there was one last SNF who was trying to obtain insurance auth and they would let me know on Friday morning. If accepted, then the pt could be transferred to SNF instead of home.               ICU RN informed me that the patient's wife and family at the bedside did not think they could manage his care at home (pt is on oxygen and would need pain meds SQ...which home care agency confirmed they could provide and teach family how to administer).              I called pt's wife and spoke with Rosa's twin sister who said that her father lives in a trailer and it is too small to provide care and have visitors so they decided against home hospice. I informed her that then the choices would be SNF-Hospice vs. [facility]. I agreed to expand the search for SNFs but let them know that insurance companies where closed on the weekend so if SNF wasn't found by Friday then patient would have to stay at [facility] through the weekend if they didn't want him to go home.              Will let family know on Friday morning if the remaining SNF is able to accept.

## 2019-12-01 NOTE — Consults (Signed)
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 No ENT or NIR intervention possible, and patient has been transitioned to DNAR/DNI with plans to transfer to hospital closer to patient's family or home hospice.       Diet Order: NPO  Enteral Nutrition Order: (6/17) held for extubation   (6/16) Novasource Renal at 15 mL/hr (Trickle TFs)  (6/14) TF stopped due to high residuals  (6/13) Novasource Renal at 50 mL/hr (goal)     Advanced Therapies:  Mechanical Ventilation   HD    Anthropometric Measurements:  Ht: 172.7 cm (_0 )  Admit Weight (6/7): 84.4 kg (121% IBW)  Current Weight (6/9): 84.4 kg   IBW: 70 kg +/- 10%  UBW: unable to obtain  BMI: 28.3 kg/m2 (based on 84.4 kg wt) c/w overweight for height status    In-house weight trends:  (6/17) 91.6 kg  Comments:   Increasing weight in-house likely fluid related given edema and positive I/O's.  Per I&O's pt is net +5.7 L over the course of admission.    Food/Nutrition Related history:  Pt started on TFs (6/10) and held same day;  then restarted (6/12) and reached goal.  TF's held (6/14) d/t elevated TF residuals and restarted with trickle feeds (6/15) though held (6/16) and cont's to be held (6/17) for extubation.    Known food allergies & intolerances: none known    Nutrition-Focused Physical Findings:  Cognition: (6/17) RASS -2, CAM-ICU UTA   Functional Status: Per PT note, Activity Achieved: Passive ROM (11/30/19 2100)  AM-PAC Mobility 6 Clicks Score: 6 (65/46/50 2100)  Fluid status:   UTA    Medical tests/procedures:  KUB (6/14):  Enteric tube with tip and sidehole in the body of the stomach.  Bowel gas pattern is unremarkable, nonobstructive.    GI history:    Abdomen Inspection: Soft;Rounded (06/17)  Bowel Sounds (All Quadrants): Active (06/17)  Tenderness: Soft;Nontender (06/17)  Passing Flatus: Yes (06/17)  Stool: (6/16) x3 BM  TF Residuals: (6/15) 0-200 mL; (6/14) 100-650 mL; (6/13) 0 mL  UO Output: anuric  Comments: Pt with transient elevated residuals limiting ability to provide adequate EN support.  .    Microbiology:  reviewed    Integumentary:  Skin Condition:   Pressure Injury (Ulcer) 11/28/19 Coccyx Deep Tissue Pressure Injury (ulcer)    Biochemical profile:   Renal/Lytes:  Lab Results   Component Value Date    NA 129 (L) 12/01/2019    K 4.1 12/01/2019    CL 95 (L) 12/01/2019    CO2 23 12/01/2019    BUN 38 (H) 12/01/2019    CREAT 4.97 (H) 12/01/2019    MG 2.0 12/01/2019    PO4 4.8 (H) 12/01/2019    GFRAA 13 (L) 12/01/2019   Comments: ESRD managed with HD      Endocrine/Exocrine:  Lab Results   Component Value Date    GLUPC 122 12/01/2019    GLUPC 100 12/01/2019    GLUPC 117 12/01/2019    GLUPC 103 11/30/2019    GLUPC 116 11/30/2019    GLUPC 127 11/30/2019    GLUPC 126 11/30/2019    GLUPC 124 11/30/2019    GLUPC 129 11/30/2019  GLUPC 159 11/29/2019    GLUPC 77 11/29/2019    [place] 138 11/29/2019     Lab Results   Component Value Date    GLU 117 12/01/2019   Comments: controlled blood sugars    Liver/GI:   Lab Results   Component Value Date    AST 12 11/22/2019    ALT 10 11/22/2019    ALKP 50 11/22/2019    TBILI 0.8 11/22/2019    PT 17.4 (H) 11/23/2019    INR 1.5 (H) 11/23/2019     No results found for: NH3  No results found for: LIPA  No results found for: AMY  Comments: stable    CV profile:   MAPs (6/17) 70s, no recent lactate found  Comment:  HD stable - no vasoactive infusions    Lipid Profile:   Triglycerides, serum   Date Value Ref Range Status   11/30/2019 83 <200 mg/dL Final   11/28/2019 65 <200 mg/dL Final   11/26/2019 88 <200 mg/dL Final   11/24/2019 94 <200 mg/dL Final   11/22/2019 117 <200 mg/dL Final   Comments: stable    Vitamin/Mineral Profile:   (6/10) 25-OH Vitamin D 16 (deficient)    Inflammatory Response Profile:    WBC Count   Date Value Ref Range Status   11/30/2019 10.7 (H) 3.4 - 10.0 x10E9/L Final   11/27/2019 14.0 (H) 3.4 - 10.0 x10E9/L Final     C-Reactive Protein   Date Value Ref Range Status   11/24/2019 302.5 (H) <5.1 mg/L Final     Prealbumin   Date Value Ref Range Status   11/24/2019 7 (L) 20 - 37 mg/dL Final   Comment: Recent depressed PAB with a significantly elevated CRP indicative of increased metabolic stress in the setting of an acute inflammatory response.      Nutritionally Relevant Medications:    acetaminophen  1,000 mg Feeding Tube Q8H [place]    amLODIPine  10 mg Feeding Tube Daily [place]    atorvastatin  40 mg Feeding Tube Q PM [place]    B-complex with vitamin C-Folic Acid  1 tablet Per OG Tube Daily [place]    carvediloL  12.5 mg Feeding Tube BID SCH    hydrALAZINE  50 mg Feeding Tube Q6H [place]    insulin aspart  0-40 Units Subcutaneous Q4H [place] (Insulin)    lansoprazole  30 mg Feeding Tube Q AM Before Breakfast [facility]     Continuous medication:   dextrose 50 mL/hr (12/01/19 1100)    niCARdipine Stopped (12/01/19 1004)     Comparative Standards: Estimated Needs (based on 84.4 kg wt):  Energy: 2250  - 2450 kcal/day (REE 1619 x 1.4 - 1.5)  _0  [place]. Jeor Equation  Protein: 100 - 120 g/day (1.2 - 1.4 g/kg) with HD  _1  using 84.4 kg  Fluid: Per primary team    Avg intake from TF and Propofol over past x7 days (6/10-6/16):           TF:             612 kcal, 21 g protein  (met 27% of estimated energy req'ts and 21% protein goal)   Propofol:    290 kcal, 0 g protein  Total:        902 kcal, 21 g protein  (met 40% of estimated energy req'ts and 21% protein goal)     Nutrition Diagnosis:   1. ACTIVE: Inadequate oral intake related to mechanical ventilation dependency as evidenced by NPO  status and requiring nutrition support..    2. NEW: Increased nutrient needs (protein and energy) related to wound healing as evidenced by presence of   pressure injury    3. NEW:  Inadequate enteral nutrition infusion related to slow initiation and interruptions to TF regimen as evidenced by comparison of averaged TF intake over the past 7 days with estimated needs.    Nutrition Intervention:  Enteral Nutrition:    --> if TF's to cont within [place], rec'd reposition FT post-pyloric   --> as tolerated, cont trickle TF's with Novasource Renal @ 15 mL/hr.  --> if within pt's GOC and GI status stable, may cont to advance by 24m/hr q 12 hrs to goal of 50 mL/hr.  Goal TF provides 1200 mL, 2400 kcal, 109 g protein, 9.2 g CHO/hr, meets 100% RDI for vitamins/minerals.    TF Monitoring:  -->Monitor gastric residuals, abdominal exam, lactate trends, hemodynamic changes  -->Follow serum BUN, Creat, lytes, BG, ion Ca, Mg, Phos; adjust TF regimen prn  -->Cont to monitor I/O's   -->Check TF residuals q 8 hours, and hold if > 500 mL    Inflammatory Response:  --> will monitor weekly CRP and prealbumin (track trend).to help assess adequacy of nutrition support regimen.    Vitamin and Mineral Supplements:   --> if within pts GOC, cont nephrovite 1 tablet, daily via FT   --> if within pts GOC, rec'd Vitamin D (cholecalciferol tabs, 4000 units/day) via FT.  Additional zinc supplementation to aid healing of DTI not rec'd at this time 2/2 GOC plan.    Nutrition-Related Medication Management:  --> Recommended avoid all sorbitol containing elixir meds (ie: tylenol solution, KCl solution) to prevent GI distress (diarrhea, gas, bloating)    --> Blood glucose management per team. Aim to keep blood glucose between 100-160 mg/dL  --> Management of nutrition-impact symptoms per team    Monitoring & Evaluation / Goals:  See NArnold Linefor goals / progress.    MErin Hearing RD, CNSC  [place]: [phone]   For Evenings, Weekends or HUmber View HeightsPage [phone]

## 2019-12-01 NOTE — Progress Notes (Signed)
OTOLARYNGOLOGY - HEAD AND NECK SURGERY  History and Physical/Consult Note    Chief Complaint: high volume epistaxis    Requesting Provider: Donivan Scull MD    Requesting Service: ICU    History of Present Illness: William Sherman is a 64 y.o. 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    Review of Systems:  Unable to obtain complete ROS due to patient's acuity of condition.     --- For other relevant health data, including Allergies, Home medications, and Health history, please see addendum of note ---    Objective:  BP 126/68 (BP Location: Right thigh, Patient Position: Lying)   Pulse 73   Temp 36.4 C (97.5 F) (Oral)   Resp 17   Ht 172.7 cm (_0 )   Wt 89.6 kg (197 lb 8.5 oz) Comment: calculation  SpO2 97%   BMI 30.03 kg/m      Vent settings:  Mode: PSV (06/17 0840)  Flow Rate Set (lpm): DF (06/17 0840)  PEEP/CPAP Set (cm H2O): 5 cm H2O (06/17 0840)  FiO2 (%): 100 % (06/17 1620)  Pressure Support: 5 cmH20 (06/17 0840)        Intake/Output Summary (Last 24 hours) at 12/01/2019 1621  Last data filed at 12/01/2019 1400  Gross per 24 hour   Intake 2592.41 ml   Output 2650 ml   Net -57.59 ml       Physical Exam:  General: Ill appearing man, intubated  Neurological:  sedated   Cardio/Pulmonary: mechanically ventilated  HEENT:      Head/Face: NCAT, no worrisome face lesions     Eyes: left pupil and iris opaque, right pupil and iris dark brown     Ears: External ears normal-appearing     Nose: External nose normal-appearing, packed bilaterally with rhinorockets     Oral cavity: 8.0 ETT in place. No active bleeding      Neck: thick wide neck, supple, flat, trachea midline, no cervical or supraclavicular lymphadenopathy       Neuro: sedated    Selected Results:  I have reviewed the following laboratory data --        11/30/19  1416   WBC 10.7*   HCT 26.8*   PLT 268           12/01/19  0340 11/30/19  0305 11/29/19  0210   NA 129* 133* 132*   K 4.1 3.9 3.9   CL 95* 97* 97*   CO2 _1 CREAT 4.97* 3.98* 5.43*   GLU 117 123 155   MG 2.0 2.1 2.1   PO4 4.8* 3.6 3.8       Lab Results   Component Value Date    WBC Count 10.7 (H) 11/30/2019    Hemoglobin 8.5 (L) 11/30/2019    Hematocrit 26.8 (L) 11/30/2019    MCV 92 11/30/2019    Platelet Count 268 11/30/2019     Lab Results   Component Value Date    NA 129 (L) 12/01/2019    K 4.1 12/01/2019    CL 95 (L) 12/01/2019    CO2 23 12/01/2019    BUN 38 (H) 12/01/2019    CREAT 4.97 (H) 12/01/2019    GLU 117 12/01/2019  Lab Results   Component Value Date    Calcium, total, Serum / Plasma 8.8 12/01/2019    Phosphorus, Serum / Plasma 4.8 (H) 12/01/2019     Lab Results   Component Value Date    Magnesium, Serum / Plasma 2.0 12/01/2019     Lab Results   Component Value Date    Alanine transaminase 10 11/22/2019    AST 12 11/22/2019    Alkaline Phosphatase 50 11/22/2019    Bilirubin, Total 0.8 11/22/2019     Lab Results   Component Value Date    Int'l Normaliz Ratio 1.5 (H) 11/23/2019    PT 17.4 (H) 11/23/2019     Lab Results   Component Value Date    Activated Partial Thromboplastin Time 36.0 (H) 11/23/2019     No results found for: CK, MBMU, TRPI, TRIPZ  No results found for: POCTLEUK, POCTNITRITE, POCTPROTEIN, POCTPHUR, POCTRBCUR, POCTSPECGRAV, POCTKETONESU, POCTBILIUR, POCTGLUCUR      Review of Other Relevant Data:  Radiology Results  No results found.    The plan regarding William Sherman was discussed with [place] Attending Surgeons, Drs. Caron Presume and Christeen Douglas.    Assessment and Plan:  William Sherman ([id number]) - [id number]  64 y.o. male with history of DM, HTN, ESRD on HD, CVA (non-verbal, blind, deaf and non-ambulatory at baseline) admitted for large volume epistaxis and found to have a right  nasal mass with extension into the skull base who presents as OSH transfer following episode of high volume epistaxis requiring bilateral rhinorocket placement. 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 Per discussion with OSH [facility] doc/ID revealed that the patient likely developed a severe ear infection with worsening and invasion in the skull base and surrounding structures. Pathogens that can cause this extensive disease include Staph aureus, Pseudomonas, and Fungal infections (aspergillous, mucor). Biopsy and resection of this area is not recommended by [facility] at this time given high morbidity and mortality associated with this intervention. Given all this information family would like to pursue focus on comfort care for William Sherman. Code status changed to DNR. Will continue intubation and supportive care for now with hopes of being able to transfer him to a facility closer to family for comfort care.    Recommend  - appreciate excellent global care per primary team  - pending transfer back to OSH closer to home; appreciate CM and SW assistance with these logistics  - [facility] will continue to follow while in house for active bleeding. Remains hemostatic today. Please alert [facility] to any acute changes in clinical status.     Juanetta Snow MD  Resident Physician, PGY-2  Otolaryngology - Head & Neck Surgery  12/01/19    Code Status:   DNR/DNI    --- Please page/call the Otolaryngology - Head & Neck Surgery Consult service pager with any questions ---      Additional Medical History    Medications Prior to Admission:  Scheduled Meds:   0.9% sodium chloride flush  3 mL Intravenous Q8H [place]    acetaminophen  1,000 mg Feeding Tube Q8H [place]    amLODIPine  10 mg Feeding Tube Daily [place]    atorvastatin  40 mg Feeding Tube Q PM [place]    B-complex with  vitamin C-Folic Acid  1 tablet Per OG Tube Daily [place]    carvediloL  12.5 mg  Feeding Tube BID [place]    heparin flush  300 Units Intravenous Daily [place]    hydrALAZINE  50 mg Feeding Tube Q6H [place]    insulin aspart  0-40 Units Subcutaneous Q4H [facility] (Insulin)    lansoprazole  30 mg Feeding Tube Q AM Before Breakfast [place]    meropenem  1,000 mg Intravenous Q PM [place]    vancomycin  500 mg Intravenous After Hemodialysis     Continuous Infusions:   dextrose 50 mL/hr (12/01/19 1400)    niCARdipine 12.5 mg/hr (12/01/19 1526)     PRN Meds:   0.9% sodium chloride flush  3 mL Intravenous PRN    dextrose  0-150 mL/hr Intravenous Continuous PRN    dextrose 50%  6.25 g Intravenous Q15 Min PRN    glucose  20 g Feeding Tube Q15 Min PRN    heparin flush  300 Units Intravenous PRN    HYDROmorphone  0.2 mg Intravenous Q2H PRN    lidocaine (PF)  5 mL Subcutaneous PRN       Allergies:  Allergies/Contraindications  No Known Allergies    Past Medical History:  No past medical history on file.    Past Surgical History:  No past surgical history on file.    Social History:  Social History     Socioeconomic History    Marital status: Married     Spouse name: Not on file    Number of children: Not on file    Years of education: Not on file    Highest education level: Not on file   Occupational History    Not on file   Tobacco Use    Smoking status: Not on file   Substance and Sexual Activity    Alcohol use: Not on file    Drug use: Not on file    Sexual activity: Not on file   Other Topics Concern    Not on file   Social History Narrative    Not on file     Social Determinants of Health     Financial Resource Strain:     Difficulty of Paying Living Expenses:    Food Insecurity:     Worried About [place] in the Last Year:     [place] in the Last Year:    Transportation Needs:     Film/video editor (Medical):     Lack of Transportation (Non-Medical):    Physical Activity:     Days of Exercise per  Week:     Minutes of Exercise per Session:    Stress:     Feeling of Stress :    Social Connections:     Frequency of Communication with Friends and Family:     Frequency of Social Gatherings with Friends and Family:     Attends Religious Services:     Active Member of Clubs or Organizations:     Attends Music therapist:     Marital Status:    Intimate Partner Violence:     Fear of Current or Ex-Partner:     Emotionally Abused:     Physically Abused:     Sexually Abused:       Social history was reviewed and is non-contributory to this illness.    Family History:     Family history was reviewed and is non-contributory to this illness.

## 2019-12-01 NOTE — Interdisciplinary (Signed)
[facility] CARE SERVICES  Chaplain available on-call 24/7 by paging [phone] at [place].    William Sherman is a 64 y.o. male     Chaplain briefly visited with patient.  Patient is sleeping at this time.    Chaplain introduced self and the Spiritual Care Services to the RN who requested a visit for the patient via "New Consults".    Per RN, some family members were present today for visitation but they left already for the day.    Per RN: Sharyn Lull)  -Patient is a "blind person" (no sight);  -Patient is a "deaf person" (no hearing);  -Patient cannot speak (no articulation);  -Patient in the past, can only speak SPANISH (& even a translator-interpreter assistance device would be having a difficulty to mediate for communication)  [place]...    RN suggested to Chaplain that maybe to give patient a BLESSING-PRAYER would be the most beneficial thing at this time & so Chaplain did take this advise/ suggestion of the  RN.    Chaplain offered spiritual and emotional support to patient who is fast asleep.    Patients core spiritual need is assessed as: For a sense of meaning and direction; and for strengthening of hopefulness and resilience.    Chaplain will seek to address the following spiritual issues: Clarifying a commitment to a particular meaning, purpose, and direction; and assisting to develop and/or sustain capacities for hoping and adaptation.    Chaplain will attempt a follow-up visit on or before 5 days from this visit date to address patients continuing spiritual needs.    Resident Chaplain  [place]  12/01/2019

## 2019-12-01 NOTE — Nursing Note (Signed)
Pre Treatment Nursing Note  Pre treatment report received from [place], [place]  Pre treatment vitals: Wt 91.6 kg via Bed scale, ART 133/46, Pulse 67.  3 hours HD started in Pt Room via R-AVF QB 400 ml/min, ordered UF 2 L as tolerated. 200cc NS prime.    Medication Administration:  Aranesp: 0 mcg IVP  Zemplar: 0 mcg IVP  Blood Transfusion: No   After HD MAR reviewed and After HD MAR: the following medications are due after HD: routine morning medication    Post Treatment Nursing Note  Post tx vitals: Wt 89.6 kg via calculation, BP 126/68, Pulse 68.   Pt tolerated 3 hr treatment with net UF 2 L fluid removed.     Post treatment AVF bleeding time=10 minutes. Bruit + & Thrill +. no lidocaine.     AVF, ulcer in the middle with drainage, primary nurse will put mepliex dressing on after HD.     Treatment orders were not adjusted during the run.     There were no adverse reactions/complications during this procedure.     Post treatment report given to: Burman Nieves, RN

## 2019-12-01 NOTE — Progress Notes (Signed)
CRITICAL CARE PROGRESS NOTE       24 Hour Events:  - Nicardipine gtt for improved blood pressure control  - Tolerated PSV yesterday and overnight   - TF stopped due to high OGT residuals   - Discussion with family who agree with plan to remove endotracheal tube and not reintubate     Subjective:  - Intubated, sedated     Medications:   Scheduled Meds:   sodium chloride for dialysis circuit  50-250 mL Intravenous Once in Dialysis    sodium chloride for dialysis circuit  20-250 mL Intravenous Once    0.9% sodium chloride flush  3 mL Intravenous Q8H [place]    acetaminophen  1,000 mg Feeding Tube Q8H [place]    amLODIPine  10 mg Feeding Tube Daily [place]    atorvastatin  40 mg Feeding Tube Q PM [place]    B-complex with vitamin C-Folic Acid  1 tablet Per OG Tube Daily [place]    carvediloL  12.5 mg Feeding Tube BID [place]    heparin flush  300 Units Intravenous Daily [place]    hydrALAZINE  50 mg Feeding Tube Q6H [place]    insulin aspart  0-40 Units Subcutaneous Q4H SCH (Insulin)    lansoprazole  30 mg Feeding Tube Q AM Before Breakfast [place]    meropenem  1,000 mg Intravenous Q PM [place]    oxyCODONE  10 mg Feeding Tube Q4H [place]    vancomycin  500 mg Intravenous After Hemodialysis    white petrolatum-mineral oil  1 Application Both Eyes 4x Daily [place]     Continuous Infusions:   dextrose 50 mL/hr (12/01/19 0800)    fentaNYL Stopped (11/30/19 0509)    niCARdipine 10 mg/hr (12/01/19 0800)    propofoL Stopped (11/30/19 1245)     PRN Meds:    sodium chloride bolus  250 mL Intravenous Q2 Min PRN    sodium chloride for dialysis circuit  100 mL Intravenous Q30 Min PRN    0.9% sodium chloride flush  3 mL Intravenous PRN    dextrose  0-150 mL/hr Intravenous Continuous PRN    dextrose 50%  6.25 g Intravenous Q15 Min PRN    fentaNYL citrate  25-50 mcg Intravenous Q15 Min PRN    glucose  20 g Feeding Tube Q15 Min PRN    heparin flush  300 Units Intravenous PRN    lidocaine   Topical Once PRN    lidocaine (PF)  0.2 mL Intradermal PRN     lidocaine (PF)  5 mL Subcutaneous PRN       NEUROLOGIC  Exam: Opening eyes, withdrawing from pain   CPOT: Total  Min: 0  Max: 3  Pain meds:   - Tylenol 1,000 mg q8h [place]  - Fentanyl gtt off  - Fentanyl PRN total 350 mcg /24 hours   - Oxycodone 10 mg q4h [place]  RASS: Light sedation, Briefly (less than 10 seconds) awakens with eye contact to voice  Sedation meds:   - Propofol off  CAM-ICU: Positive    CARDIOVASCULAR  Pulse  Min: 66  Max: 84  Most recent: 71  BP  Min: 123/61  Max: 123/61  Most recent: 123/61; MAP (mmHg)  Min: 79 mmHg  Max: 79 mmHg  Most recent: 79 mmHg  Vasoactives:   - Nicardipine 10 mg/hr  - Amlodipine 10 mg every day  - Carvedilol 12.5 mg BID  - Hydralazine 50 mg q6h [place]  - Labetalol, hydralazine PRN   Exam: RRR  No results found in last 72 hours    Temp:  [37.2 C (99 F)] 37.2 C (99 F)  Heart Rate:  [66-84] 71  BP: (123)/(61) 123/61  Arterial Line BP (mmHg) : (113-158)/(37-53) 147/49  *Resp:  [7-21] 8  FiO2 (%):  [30 %] 30 %  SpO2:  [93 %-100 %] 100 %    RESPIRATORY:  Resp  Min: 7  Max: 21  Most recent: (!) 8  SpO2  Min: 93 %  Max: 100 %  Most recent: 100 %  Exam: Intubated, mechanically ventilated   Oxygen/ventilatory therapy:  Mode: PSV (06/17 0301)  Flow Rate Set (lpm): DF (06/17 0301)  PEEP/CPAP Set (cm H2O): 5 cm H2O (06/17 0301)  FiO2 (%): 30 % (06/17 0800)  Pressure Support: 5 cmH20 (06/17 0301)  Recent Labs     12/01/19  0339 11/30/19  1051 11/30/19  0304 11/28/19  1819   PH37 7.45 7.43 7.44 7.41   PCO2 40 42 44 45   PO2 91 87 94 105   HCO3 27 28 30* 28   SAO2 98 97 98 99   FIO2 30.00 30.00 30.00 30.00     CXR:   No results found.    GASTROINTESTINAL  Exam: Soft, nontender, non distended  Diet: TF goal off     OGT 250 cc / 24 hours   Stool x 3     No results for input(s): TBILI, DBILI, AST, ALT, ALKP, GGT, ALB in the last 72 hours.    RENAL  Recent Labs     12/01/19  0340 11/30/19  0305 11/29/19  0210   NA 129* 133* 132*   K 4.1 3.9 3.9   CL 95* 97* 97*   CO2 _0 BUN 38* 29* 48*    CREAT 4.97* 3.98* 5.43*   GLU 117 123 155   CA 8.8 8.9 9.4   MG 2.0 2.1 2.1   PO4 4.8* 3.6 3.8     Fluids (last 24 hours):    Intake/Output Summary (Last 24 hours) at 12/01/2019 0832  Last data filed at 12/01/2019 0800  Gross per 24 hour   Intake 2259.86 ml   Output 250 ml   Net 2009.86 ml     Last HD Tuesday     HEMATOLOGY  Recent Labs     11/30/19  1416   HGB 8.5*   HCT 26.8*   PLT 268     Blood products (last 24 hours): None  Anticoagulation: None    INFECTIOUS DISEASE/IMMUNOLOGIC  Temp  Min: 37.2 C (99 F)  Max: 37.2 C (99 F)  Most recent: 37.2 C (99 F)  Recent Labs     11/30/19  1416   WBC 10.7*     Microbiology results:  Microbiology Results (last 72 hours)     Procedure Component Value Units Date/Time    Central Blood Culture [[id number]] Collected: 11/23/19 1657    Order Status: Completed Specimen: Central Blood Updated: 11/29/19 0717     Central Blood Culture No growth 6 days.    Peripheral Blood Culture [[id number]] Collected: 11/23/19 1711    Order Status: Completed Specimen: Peripheral Blood Updated: 11/29/19 0715     Peripheral Blood Culture No growth 6 days.        Antimicrobials: Meropenem, vancomycin (dialysis dosing)    ENDOCRINE  Blood Glucose (mg/dL)- manual entry  Min: 100  Max: 127  Most recent: 100  Recent Labs     12/01/19  0340 11/30/19  0305 11/29/19  0210   GLU 117 123 155     Insulin orders: ISS    Imaging within last 24 hours:  No results found.    Pending Work-up:  Pending Labs     Order Current Status    Arterial Blood Gas only Collected (11/30/19 0305)    Add On Differential In process          ASSESSMENT/PLAN:  William Sherman is a 64 y.o. 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 No ENT or NIR intervention possible, and patient has been transitioned to DNAR/DNI with plans to transfer to hospital closer to patient's family or home  hospice.       NEURO:  # Pain  - APAP  - Fentanyl PRN  - Oxycodone 5 mg q4h PRN (stopped scheduled due to apnea episodes)  - Start dilaudid 0.2 mg q2h PRN     # Sedation  - None     CV:   # SBP < 140 in setting of internal carotid bleeding  # HTN  - Nicardipine gtt   - Amlopidine 10 mg every day   - Carvedilol 12.5 mg BID  - Hydralazine 50 mg q6h [place]    RESP:   # Intubated for airway protection  Tolerated SBT for over 24 hours   - Extubate later this morning when family is here    GI:   # NPO  High residuals but ongoing bowel movements.  - Holding TF in setting of imminent extubation     RENAL:   # ESRD on iHD  - Nephrology following, T/Th/Sa schedule      HEME:   # Ongoing bleeding  - ENT packing in place   - Monitor CBC     ID:   # Skull bas osteomyelitis  - Vanc, meropenem    ENDO:   # DMT2  - ISS    ICU BUNDLE  Code status: Code Status: DNAR/DNI      Patient Lines/Drains/Airways Status    Active LDAs     Name:   Placement date:   Placement time:   Site:   Days:    PSI Introducer 11/22/19 Left Internal jugular   11/22/19    1430       8    Peripheral IV 11/21/19 Anterior;Right External Jugular   11/21/19        External Jugular   10    ETT  Single Cuffed 8.0 mm   11/21/19    1600     9    Arterial Line 11/23/19 Femoral   11/23/19    0054     8    Arteriovenous Access AV Fistula Right;Upper Arm                Arteriovenous Access AV Fistula Left;Lower Arm                Pressure Injury (Ulcer) 11/28/19 Coccyx Deep Tissue Pressure Injury (ulcer)   11/28/19    0806     3    Wound 11/22/19 Other (Comment) Arm Distal;Right;Upper;Anterior   11/22/19    1731    Arm   8    GI Drain 11/21/19 Orogastric Center mouth   11/21/19         10  PT/OT: No  DVT prophylaxis: None  Stress Ulcer prophylaxis: Pepcid  Diet: NPO, TF    Bowel regimen: None  Dispo: ICU     I discussed the care of this patient with ICU attending and fellow.     Please contact the Critical Care Service with questions:  13 ICU: ph  [phone], pgr [phone]    Elias Else, MD  12/01/19  Critical Care Medicine Service

## 2019-12-01 NOTE — Consults (Signed)
NEPHROLOGY FOLLOW UP CONSULT NOTE     Events  started on nicardipine infusion for blood pressure control    I personally examined the patient on hemodialysis today.  Indication: ESRD  Prescription:  Treatment Type HD Only   HD Duration (Hrs) 3   UF goal for HD (kg) 2   K+ 3 mEq/L   Ca++ 2.5 mEq/L   Na+ 138 mEq/L   Bicarb 35 mEq/L   Dialysate Flow 2 x Autoflow   Dialysate Temperature (C) 37   Na+ Model Program None   UF Profile for HD None   Access Type AVF   BFR-As tolerated to a maximum of: 400 mL/min   Dialyzer F160 (83 mL)   Machine Lines Adult (108 mL)   Crit-line RN Discretion   Goal BP type SBP   Goal SBP> 90      Subjective  Intubated/sedated    Review of Systems    Review of systems unobtainable (pt intubated)    Vitals  Temp:  [36 C (96.8 F)-37.3 C (99.1 F)] 37.3 C (99.1 F)  Heart Rate:  [66-79] 77  *Resp:  [6-22] 15  BP: (101-137)/(61-70) 137/68  Arterial Line BP (mmHg) : (113-155)/(37-52) 135/46  FiO2 (%):  [28 %-100 %] 28 %  SpO2:  [92 %-100 %] 92 %    Most Recent Weight: 89.6 kg (197 lb 8.5 oz) (calculation)  Admission Weight: (S) 84.4 kg (186 lb 1.1 oz)      Intake/Output Summary (Last 24 hours) at 12/01/2019 2358  Last data filed at 12/01/2019 2300  Gross per 24 hour   Intake 3225.16 ml   Output 2400 ml   Net 825.16 ml       Current IP Medications  Scheduled Meds:   0.9% sodium chloride flush  3 mL Intravenous Q8H [place]    acetaminophen  1,000 mg Feeding Tube Q8H [place]    amLODIPine  10 mg Feeding Tube Daily [place]    atorvastatin  40 mg Feeding Tube Q PM [place]    B-complex with vitamin C-Folic Acid  1 tablet Per OG Tube Daily [place]    carvediloL  12.5 mg Feeding Tube BID [place]    heparin flush  300 Units Intravenous Daily [place]    hydrALAZINE  50 mg Feeding Tube Q6H [place]    insulin aspart  0-40 Units Subcutaneous Q4H SCH (Insulin)    lansoprazole  30 mg Feeding Tube Q AM Before Breakfast [place]    meropenem  1,000 mg Intravenous Q PM [place]    vancomycin  500 mg Intravenous After Hemodialysis      Continuous Infusions:   dextrose 50 mL/hr (12/01/19 2300)    niCARdipine 13 mg/hr (12/01/19 2300)     PRN Meds:   0.9% sodium chloride flush  3 mL Intravenous PRN    dextrose  0-150 mL/hr Intravenous Continuous PRN    dextrose 50%  6.25 g Intravenous Q15 Min PRN    glucose  20 g Feeding Tube Q15 Min PRN    heparin flush  300 Units Intravenous PRN    HYDROmorphone  0.2 mg Intravenous Q2H PRN    lidocaine (PF)  5 mL Subcutaneous PRN       Physical Exam  General: critically ill, sedated  Eyes: normal conjunctivae, anicteric sclerae  ENMT: normal external nose and ears; mucous membranes moist; ETT in place  Respiratory: coarse upper airway sounds  Cardiovascular: regular rate & rhythm; normal s1/s2; no rub  Gasterointestional: soft abdomen, non-tender, non-distended, positive  active bowel sounds  Musculoskeletal: no clubbing/cyanosis, 2+ edema  Skin/Integumentary: no obvious rash  Neurologic: sedated  Psychiatric: sedated  Heme/lymphatic: no excessive bruising or bleeding  Dialysis access: RAVF    Data  All labs available in last 3 days in [facility] reviewed.          12/01/19  0340 12/01/19  0339 11/30/19  1416 11/30/19  1051 11/30/19  0305 11/30/19  0304 11/29/19  0210   WBC  --   --  10.7*  --   --   --   --    HCT  --   --  26.8*  --   --   --   --    PLT  --   --  268  --   --   --   --    NA 129*  --   --   --  133*  --  132*   K 4.1  --   --   --  3.9  --  3.9   CL 95*  --   --   --  97*  --  97*   CO2 23  --   --   --  25  --  23   BUN 38*  --   --   --  29*  --  48*   CREAT 4.97*  --   --   --  3.98*  --  5.43*   GLU 117  --   --   --  123  --  155   CA 8.8  --   --   --  8.9  --  9.4   MG 2.0  --   --   --  2.1  --  2.1   PO4 4.8*  --   --   --  3.6  --  3.8   PH37  --  7.45  --  7.43  --  7.44  --    PCO2  --  40  --  42  --  44  --    PO2  --  91  --  87  --  94  --            Assessment and Recommendations  William Sherman is a 64 y.o. male ESKDon HD TTS via AVF, diabetes, prior stroke,w/ right  nasopharyngeal tumor who presents after high volume epistaxis with hemoglobin drop and requiring intubation for airway protection. Nonsurgical candidate, awaiting transfer back to a hospital closer to family's home prior to transitioning to hospice care.    End stage renal disease  Dialysis  -Maintenance dialysis TTS  -Volume status: volume overload  -Access:    Blood Pressure  plan PUF tomorrow to assist with blood pressure control    Sodium  Sodium, Serum / Plasma   Date Value Ref Range Status   12/01/2019 129 (L) 135 - 145 mmol/L Final   restrict free water to 1 L daily    Potassium  Potassium, Serum / Plasma   Date Value Ref Range Status   12/01/2019 4.1 3.5 - 5.0 mmol/L Final   satisfactory    Acid-Base  Carbon Dioxide, Total   Date Value Ref Range Status   12/01/2019 23 22 - 29 mmol/L Final   satisfactory    Anemia  Hemoglobin   Date Value Ref Range Status   11/30/2019 8.5 (L) 13.6 - 17.5 g/dL Final   adequate; recommend continuing Aranesp 100 g IV weekly  Mineral Metabolism  Phosphorus, Serum / Plasma   Date Value Ref Range Status   12/01/2019 4.8 (H) 2.3 - 4.7 mg/dL Final   satisfactory    Calcium, total, Serum / Plasma   Date Value Ref Range Status   12/01/2019 8.8 8.4 - 10.5 mg/dL Final   satisfactory    Nutrition  Albumin, Serum / Plasma   Date Value Ref Range Status   11/22/2019 2.1 (L) 3.4 - 4.8 g/dL Final   goal for protein intake should be at least 1.2 g/kg per day  He has a renal vitamin prescribed    Drug dosing  All medications reviewed    Medication dosing for ESRD consideration  - Dose medications per guidelines for patients receiving hemodialysis.   - Pain control: Avoid morphine, Demerol. Preferable to use oxycodone,hydromorphone or fentanyl. Monitor closely for signs of opiate toxicity.   - Baclofen is contraindicated  - Cautious use of Gabapentin - max 353m/day.   - GERD: Avoid maalox, sucralfate and famotidine  - Constipation: Avoid Fleets enema. Okay to use tap water enema.        SJarome Matin GDocia FurlMD  12/01/19  Pager [phone]

## 2019-12-02 DIAGNOSIS — E8779 Other fluid overload: Secondary | ICD-10-CM

## 2019-12-02 DIAGNOSIS — N186 End stage renal disease: Secondary | ICD-10-CM

## 2019-12-02 LAB — COMPLETE BLOOD COUNT
Hematocrit: 27.6 % — ABNORMAL LOW (ref 41.0–53.0)
Hemoglobin: 8.7 g/dL — ABNORMAL LOW (ref 13.6–17.5)
MCH: 28.7 pg (ref 26.0–34.0)
MCHC: 31.5 g/dL (ref 31.0–36.0)
MCV: 91 fL (ref 80–100)
Platelet Count: 302 10*9/L (ref 140–450)
RBC Count: 3.03 10*12/L — ABNORMAL LOW (ref 4.40–5.90)
WBC Count: 14.5 10*9/L — ABNORMAL HIGH (ref 3.4–10.0)

## 2019-12-02 LAB — BASIC METABOLIC PANEL (NA, K,
Anion Gap: 10 (ref 4–14)
Anion Gap: 10 (ref 4–14)
Calcium, total, Serum / Plasma: 8.9 mg/dL (ref 8.4–10.5)
Calcium, total, Serum / Plasma: 9 mg/dL (ref 8.4–10.5)
Carbon Dioxide, Total: 24 mmol/L (ref 22–29)
Carbon Dioxide, Total: 24 mmol/L (ref 22–29)
Chloride, Serum / Plasma: 97 mmol/L — ABNORMAL LOW (ref 101–110)
Chloride, Serum / Plasma: 97 mmol/L — ABNORMAL LOW (ref 101–110)
Creatinine: 3.67 mg/dL — ABNORMAL HIGH (ref 0.73–1.24)
Creatinine: 3.99 mg/dL — ABNORMAL HIGH (ref 0.73–1.24)
Glucose, non-fasting: 139 mg/dL (ref 70–199)
Glucose, non-fasting: 135 mg/dL (ref 70–199)
Potassium, Serum / Plasma: 3.8 mmol/L (ref 3.5–5.0)
Potassium, Serum / Plasma: 3.8 mmol/L (ref 3.5–5.0)
Sodium, Serum / Plasma: 131 mmol/L — ABNORMAL LOW (ref 135–145)
Sodium, Serum / Plasma: 131 mmol/L — ABNORMAL LOW (ref 135–145)
Urea Nitrogen, Serum / Plasma: 24 mg/dL (ref 7–25)
Urea Nitrogen, Serum / Plasma: 27 mg/dL — ABNORMAL HIGH (ref 7–25)
eGFR - high estimate: 19 mL/min — ABNORMAL LOW (ref >59)
eGFR - high estimate: 18 mL/min — ABNORMAL LOW (ref >59)
eGFR - low estimate: 17 mL/min — ABNORMAL LOW (ref >59)
eGFR - low estimate: 15 mL/min — ABNORMAL LOW (ref >59)

## 2019-12-02 LAB — COVID-19 RNA, RT-PCR/NUCLEIC A: COVID-19 RNA, RT-PCR/Nucleic A: NOT DETECTED

## 2019-12-02 LAB — MAGNESIUM, SERUM / PLASMA: Magnesium, Serum / Plasma: 2 mg/dL (ref 1.6–2.6)

## 2019-12-02 LAB — POCT GLUCOSE
Glucose, iSTAT: 131 mg/dL (ref 70–199)
Glucose, iSTAT: 133 mg/dL (ref 70–199)
Glucose, iSTAT: 102 mg/dL (ref 70–199)
Glucose, iSTAT: 142 mg/dL (ref 70–199)
Glucose, iSTAT: 140 mg/dL (ref 70–199)

## 2019-12-02 LAB — PHOSPHORUS, SERUM / PLASMA: Phosphorus, Serum / Plasma: 3.8 mg/dL (ref 2.3–4.7)

## 2019-12-02 MED ORDER — SODIUM CITRATE 4 % (3 ML) INTRA-CATHETER INJECTION SYRINGE
4 | Freq: Once | Status: DC
Start: 2019-12-02 — End: 2019-12-02

## 2019-12-02 MED ORDER — SODIUM CHLORIDE 0.9 % DIALYSIS CIRCUIT
0.9 | INTRAVENOUS | Status: DC | PRN
Start: 2019-12-02 — End: 2019-12-02

## 2019-12-02 MED ORDER — LIDOCAINE (PF) 10 MG/ML (1 %) INJECTION SOLUTION
10 | INTRAMUSCULAR | Status: DC | PRN
Start: 2019-12-02 — End: 2019-12-02

## 2019-12-02 MED ORDER — HYDRALAZINE 20 MG/ML INJECTION SOLUTION: 20 mg/mL | INTRAMUSCULAR | Status: AC

## 2019-12-02 MED ORDER — HYDROMORPHONE (PF) 0.5 MG/0.5 ML INJECTION SYRINGE: 0.5 mg/0.5 mL | INTRAMUSCULAR | Status: AC | PRN

## 2019-12-02 MED ORDER — SODIUM CHLORIDE 0.9 % DIALYSIS CIRCUIT
0.9 | Freq: Once | INTRAVENOUS | Status: AC
Start: 2019-12-02 — End: 2019-12-02
  Administered 2019-12-02: 20:00:00 200 mL via INTRAVENOUS

## 2019-12-02 MED ORDER — LIDOCAINE 4 % TOPICAL CREAM
4 | Freq: Once | TOPICAL | Status: DC | PRN
Start: 2019-12-02 — End: 2019-12-02

## 2019-12-02 MED ORDER — FENTANYL (PF) 50 MCG/ML INJECTION SOLUTION: 50 mcg/mL | INTRAMUSCULAR | Status: AC | PRN

## 2019-12-02 MED ORDER — HYDRALAZINE 20 MG/ML INJECTION SOLUTION
20 mg/mL | INTRAMUSCULAR | Status: DC
  Administered 2019-12-02 – 2019-12-03 (×6): 10 mg via INTRAVENOUS

## 2019-12-02 MED ORDER — HYDROMORPHONE (PF) 0.5 MG/0.5 ML INJECTION SYRINGE: 0.5 mg/0.5 mL | INTRAMUSCULAR | 0 refills | Status: DC | PRN

## 2019-12-02 MED ORDER — PANTOPRAZOLE 40 MG INTRAVENOUS SOLUTION
4 mg/mL | INTRAVENOUS | Status: DC
  Administered 2019-12-02: 18:00:00 40 mg via INTRAVENOUS

## 2019-12-02 MED ORDER — SODIUM CHLORIDE 0.9 % DIALYSIS CIRCUIT
0.9 | Freq: Once | INTRAVENOUS | Status: AC
Start: 2019-12-02 — End: 2019-12-02
  Administered 2019-12-02: 22:00:00 200 mL via INTRAVENOUS

## 2019-12-02 MED ORDER — HYDROMORPHONE (PF) 0.5 MG/0.5 ML INJECTION SYRINGE: 0.5 mg/0.5 mL | INTRAMUSCULAR | Status: DC | PRN

## 2019-12-02 MED ORDER — LABETALOL 5 MG/ML INTRAVENOUS SOLUTION
5 mg/mL | INTRAVENOUS | Status: AC
  Administered 2019-12-03: 07:00:00 10 mg via INTRAVENOUS

## 2019-12-02 MED ORDER — FENTANYL (PF) 50 MCG/ML INJECTION SOLUTION: 50 mcg/mL | INTRAMUSCULAR | Status: DC | PRN

## 2019-12-02 MED ORDER — SODIUM CHLORIDE 0.9 % IV BOLUS
0.9 | INTRAVENOUS | Status: DC | PRN
Start: 2019-12-02 — End: 2019-12-02

## 2019-12-02 MED ORDER — LABETALOL 5 MG/ML INTRAVENOUS SOLUTION
5 mg/mL | INTRAVENOUS | Status: AC
  Administered 2019-12-03: 10 mg via INTRAVENOUS

## 2019-12-02 MED ORDER — FENTANYL (PF) 50 MCG/ML INJECTION SOLUTION
50 mcg/mL | INTRAMUSCULAR | Status: DC | PRN
  Administered 2019-12-03 (×6): 50 ug via INTRAVENOUS
  Administered 2019-12-03: 03:00:00 25 ug via INTRAVENOUS

## 2019-12-02 MED ORDER — HYDRALAZINE 20 MG/ML INJECTION SOLUTION
20 | INTRAMUSCULAR | Status: DC | PRN
Start: 2019-12-02 — End: 2019-12-02
  Administered 2019-12-02: 13:00:00 10 mg via INTRAVENOUS

## 2019-12-02 MED FILL — CARDENE 40 MG/200 ML(0.2 MG/ML) IN SOD CHLOR(ISO-OSM) INTRAVENOUS SOLN: 40 mg/200 mL (0.2 mg/mL) | INTRAVENOUS | Qty: 200

## 2019-12-02 MED FILL — HYDRALAZINE 50 MG TABLET: 50 mg | ORAL | Qty: 1

## 2019-12-02 MED FILL — CARVEDILOL 12.5 MG TABLET: 12.5 mg | ORAL | Qty: 1

## 2019-12-02 MED FILL — LANSOPRAZOLE 30 MG DELAYED RELEASE,DISINTEGRATING TABLET: 30 mg | ORAL | Qty: 1

## 2019-12-02 MED FILL — XYLOCAINE-MPF 10 MG/ML (1 %) INJECTION SOLUTION: 10 mg/mL (1 %) | INTRAMUSCULAR | Qty: 5

## 2019-12-02 MED FILL — HYDRALAZINE 20 MG/ML INJECTION SOLUTION: 20 mg/mL | INTRAMUSCULAR | Qty: 1

## 2019-12-02 MED FILL — MEROPENEM 1 GRAM INTRAVENOUS SOLUTION: 1 gram | INTRAVENOUS | Qty: 1000

## 2019-12-02 MED FILL — HEPARIN LOCK FLUSH (PORCINE) (PF) 100 UNIT/ML INTRAVENOUS SYRINGE: 100 unit/mL | INTRAVENOUS | Qty: 3

## 2019-12-02 MED FILL — AMLODIPINE 10 MG TABLET: 10 mg | ORAL | Qty: 1

## 2019-12-02 MED FILL — DILAUDID (PF) 0.5 MG/0.5 ML INJECTION SYRINGE: 0.5 mg/ mL | INTRAMUSCULAR | Qty: 0.5

## 2019-12-02 MED FILL — PANTOPRAZOLE 40 MG INTRAVENOUS SOLUTION: 4 mg/mL | INTRAVENOUS | Qty: 10

## 2019-12-02 MED FILL — NEPHRO-VITE 0.8 MG TABLET: 0.8 mg | ORAL | Qty: 1

## 2019-12-02 NOTE — Progress Notes (Addendum)
OTOLARYNGOLOGY - HEAD AND NECK SURGERY  History and Physical/Consult Note    Chief Complaint: high volume epistaxis    Requesting Provider: Donivan Scull MD    Requesting Service: ICU    History of Present Illness: William Sherman is a 64 y.o. 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    Review of Systems:  Unable to obtain complete ROS due to patient's acuity of condition.     --- For other relevant health data, including Allergies, Home medications, and Health history, please see addendum of note ---    Objective:  BP (!) 149/74   Pulse 69   Temp 36.6 C (97.9 F)   Resp (!) 7   Ht 172.7 cm (_0 )   Wt 91.1 kg (200 lb 13.4 oz)   SpO2 100%   BMI 30.54 kg/m      Vent settings:  FiO2 (%): 28 % (06/18 1600)        Intake/Output Summary (Last 24 hours) at 12/02/2019 1619  Last data filed at 12/02/2019 1438  Gross per 24 hour   Intake 2381 ml   Output 3400 ml   Net -1019 ml       Physical Exam:  General: Ill appearing man  Neurological:  sedated   Cardio/Pulmonary: normal CP effort  HEENT:      Head/Face: NCAT, no worrisome face lesions     Eyes: left pupil and iris opaque, right pupil and iris dark brown     Ears: External ears normal-appearing     Nose: External nose normal-appearing, packed bilaterally with rhinorockets     Oral cavity: face tent in place. No active bleeding      Neck: thick wide neck, supple, flat, trachea midline, no cervical or supraclavicular lymphadenopathy      Neuro: sedated    Selected Results:  I have reviewed the following laboratory data --        12/02/19  1238 11/30/19  1416   WBC 14.5* 10.7*   HCT 27.6* 26.8*   PLT 302 268           12/02/19  1238 12/02/19  0330 12/01/19  0340   NA 131* 131*  129*   K 3.8 3.8 4.1   CL 97* 97* 95*   CO2 _1 CREAT 3.99* 3.67* 4.97*   GLU 135 139 117   MG  --  2.0 2.0   PO4  --  3.8 4.8*       Lab Results   Component Value Date    WBC Count 14.5 (H) 12/02/2019    Hemoglobin 8.7 (L) 12/02/2019    Hematocrit 27.6 (L) 12/02/2019    MCV 91 12/02/2019    Platelet Count 302 12/02/2019     Lab Results   Component Value Date    NA 131 (L) 12/02/2019    K 3.8 12/02/2019    CL 97 (L) 12/02/2019    CO2 24 12/02/2019    BUN 27 (H) 12/02/2019    CREAT 3.99 (H) 12/02/2019    GLU 135 12/02/2019     Lab Results   Component Value Date    Calcium, total, Serum / Plasma 9.0 12/02/2019    Phosphorus, Serum /  Plasma 3.8 12/02/2019     Lab Results   Component Value Date    Magnesium, Serum / Plasma 2.0 12/02/2019     Lab Results   Component Value Date    Alanine transaminase 10 11/22/2019    AST 12 11/22/2019    Alkaline Phosphatase 50 11/22/2019    Bilirubin, Total 0.8 11/22/2019     Lab Results   Component Value Date    Int'l Normaliz Ratio 1.5 (H) 11/23/2019    PT 17.4 (H) 11/23/2019     Lab Results   Component Value Date    Activated Partial Thromboplastin Time 36.0 (H) 11/23/2019     No results found for: CK, MBMU, TRPI, TRIPZ  No results found for: POCTLEUK, POCTNITRITE, POCTPROTEIN, POCTPHUR, POCTRBCUR, POCTSPECGRAV, POCTKETONESU, POCTBILIUR, POCTGLUCUR      Review of Other Relevant Data:  Radiology Results  No results found.    The plan regarding Trayvion [place] was discussed with [place] Attending Surgeons, Drs. Caron Presume and Christeen Douglas.    Assessment and Plan:  Zan Orlick ([id number]) - 1353/1353-09  64 y.o. male with history of DM, HTN, ESRD on HD, CVA (non-verbal, blind, deaf and non-ambulatory at baseline) admitted for large volume epistaxis and found to have a right nasal mass with extension into the skull base who presents as OSH transfer following episode of high volume epistaxis requiring bilateral rhinorocket placement. 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 Per discussion with OSH [facility] doc/ID revealed that the patient likely developed a severe ear infection with worsening and invasion in the skull base and surrounding structures. Pathogens that can cause this extensive disease include Staph aureus, Pseudomonas, and Fungal infections (aspergillous, mucor). Biopsy and resection of this area is not recommended by [facility] at this time given high morbidity and mortality associated with this intervention. Given all this information family would like to pursue focus on comfort care for [name]. Code status changed to DNR. Now extubated, will continue supportive care for now with hopes of being able to transfer him to a facility closer to family for comfort care.    Recommend  - appreciate excellent global care per primary team  - pending transfer back to OSH closer to home; appreciate CM and SW assistance with these logistics  - [facility] will continue to follow while in house for active bleeding. Remains hemostatic today. Please alert [facility] to any acute changes in clinical status.     Juanetta Snow MD  Resident Physician, PGY-2  Otolaryngology - Head & Neck Surgery  12/02/19    Code Status:   DNR/DNI    --- Please page/call the Otolaryngology - Head & Neck Surgery Consult service pager with any questions ---      Additional Medical History    Medications Prior to Admission:  Scheduled Meds:   0.9% sodium chloride flush  3 mL Intravenous Q8H [place]    heparin flush  300 Units Intravenous Daily [place]    hydrALAZINE  10 mg Intravenous Q4H    labetalol  10 mg Intravenous Once    meropenem  1,000 mg Intravenous Q PM [place]    pantoprazole  40 mg Intravenous Daily [place]    vancomycin  500 mg Intravenous After Hemodialysis     Continuous Infusions:    PRN Meds:   0.9% sodium chloride flush  3 mL Intravenous PRN  fentaNYL  citrate  25-50 mcg Intravenous Q30 Min PRN    heparin flush  300 Units Intravenous PRN    lidocaine (PF)  5 mL Subcutaneous PRN       Allergies:  Allergies/Contraindications  No Known Allergies    Past Medical History:  No past medical history on file.    Past Surgical History:  No past surgical history on file.    Social History:  Social History     Socioeconomic History    Marital status: Married     Spouse name: Not on file    Number of children: Not on file    Years of education: Not on file    Highest education level: Not on file   Occupational History    Not on file   Tobacco Use    Smoking status: Not on file   Substance and Sexual Activity    Alcohol use: Not on file    Drug use: Not on file    Sexual activity: Not on file   Other Topics Concern    Not on file   Social History Narrative    Not on file     Social Determinants of Health     Financial Resource Strain:     Difficulty of Paying Living Expenses:    Food Insecurity:     Worried About [place] in the Last Year:     [place] in the Last Year:    Transportation Needs:     Film/video editor (Medical):     Lack of Transportation (Non-Medical):    Physical Activity:     Days of Exercise per Week:     Minutes of Exercise per Session:    Stress:     Feeling of Stress :    Social Connections:     Frequency of Communication with Friends and Family:     Frequency of Social Gatherings with Friends and Family:     Attends Religious Services:     Active Member of Clubs or Organizations:     Attends Music therapist:     Marital Status:    Intimate Partner Violence:     Fear of Current or Ex-Partner:     Emotionally Abused:     Physically Abused:     Sexually Abused:       Social history was reviewed and is non-contributory to this illness.    Family History:     Family history was reviewed and is non-contributory to this illness.

## 2019-12-02 NOTE — Plan of Care (Signed)
Problem: Fluid Volume, Imbalanced - Apheresis Hemodialysis Patient  Goal: Absence of fluid imbalance signs and symptoms - intradialytic weight gain of no more than 2 kg  Outcome: Progress within 12 hours  Goal: Absence of hypoperfusion during hemodialysis  Outcome: Progress within 12 hours  Note: Dialysis treatment  (PUF) today w/ 3L of fluid removal as tolerated.

## 2019-12-02 NOTE — Interdisciplinary (Addendum)
DISCHARGE PLAN HANDOFF NOTE    - Anticipated day and date of discharge: Saturday, 6/19 @ 6:30am  - Time-sensitive tasks: No   - D/C plan: [place]  - Transportation: ALS ambulance; PCS form completed and placed in transfer packet. [facility]: [phone]  - Authorization needed? No  - Orders complete?  Yes  - Family contacts: Daughters Vicente Males [phone]) & Basilia Jumbo [phone])  - Is the Patient and Family aware and agreeable with plan? Yes  - Weekend Service and Pager: ICU on-call [phone]  - Facility: [facility] - [address] [place] [address]  ph: [phone]; fax: [phone]. Room 129 (private room)  - Referral Method: Phone & fax (Intake: Mackie Pai)  - Primary Case Manager: Marjory Sneddon RN, [place] [phone]    What needs to be done:    - Ensure that ambulance pick up occurred as scheduled.  - Complete CM Discharge Note and pull into Interdisciplinary Note    *Transfer packet brought to 13ICU Nursing station by SL.    Final Transfer packet was sent to SNF via fax: [phone] on 6/18 @ 1600

## 2019-12-02 NOTE — Discharge Summary (Addendum)
[place]     Patient Name: William Sherman  Patient MRN: [id number]  Date of Birth: 09/01/1955    Facility: [facility]  Attending Physician: Mart Piggs, MD    Date of Admission: 11/21/2019  Date of Discharge: 12/05/2019    Admission Diagnosis: severe epistaxis   Discharge Diagnosis: severe epistaxis    Discharge Disposition: [place].  Facility Name:   [facility]    History (with Chief Complaint)    William Sherman is a 64 y.o. 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 Patient received 1 unit pRBC, KCentra 1,000 Units, and bleeding controlled with bilateral rhinorocket nasal packing. He was transferred to [facility] for higher level of care with ENT.    Brief Hospital Course by Problem  Patient was admitted to [facility] intubated, sedated, and hemodynamically stable on 11/22/19. Further MRI imaging studies showed intracranial extension of nasal mass into right middle and posterior cranial fossa with right internal carotid artery invasion and evidence of extra-axial enhancing lesion overlying the right aspect of the brainstem. CT Imaging showed extensive active extravasation from the right internal carotid artery with large pseudoaneurysm. Per discussion with OSH [facility] doc/ID revealed that the patient likely developed a severe ear infection with worsening and invasion in the skull base and surrounding structures.  After extensive discussion with ENT and neuro IR, it was decided that there was no meaningful intervention given high morbidity and morality associated with biopsy and resection of this area. Family decided that they would like to pursue focus on comfort care for William Sherman, and code status changed to DNR/DNRI. Family would have like patient to be transferred closer to home to be  near family and friends before transitioned to comfort care. He was successfully extubated on 6/17. He has no routes for enteral medications, so he was receiving IV antihypertensives and analgesics, and no nutrition at the time of transfer. On 6/19, the patient was transitioned to comfort care.     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 Patient originally requiring nicardipine gtt to meet this goal. Home antihypertensives include amlodipine, carvedilol, and hydralazine, but administration limited after extubation and OGT removal. At time of transfer, pt weaned off nicardipine infusion and started on IV hydralazine 50 mg q6hr with occasional labetalol 10 mg. Patient was transitioned to comfort care with vital sign monitoring and hypertensive medications stopped.      # h/o CVA   Prior strokes with residual deficits (nonverbal, minimal ambulation). On new imaging there is concern regarding brainstem involvement of mass.     # ESRD on iHD  Tu/Th/Sat   - HD stopped during transition to comfort care.     # DMT2  Was only requiring insulin while on tube feeds. Stopped insulin sliding scale (ISS).      Physical Exam at Discharge  BP (!) 146/74   Pulse 74   Temp 36.5 C (97.7 F) (Axillary)   Resp Marland Kitchen)  10   Ht 172.7 cm (5' 8")   Wt 91.1 kg (200 lb 13.4 oz)   SpO2 98%   BMI 30.54 kg/m     No intake or output data in the 24 hours ending 12/05/19 1516    Physical Exam  Constitutional:       Appearance: He is obese. He is ill-appearing.   HENT:      Nose:      Right Nostril: No epistaxis.      Left Nostril: No epistaxis.      Comments: bilateral nasal packing  Cardiovascular:      Rate and Rhythm: Normal rate  and regular rhythm.      Pulses: Normal pulses.   Pulmonary:      Effort: Pulmonary effort is normal.   Skin:     General: Skin is warm and dry.   Neurological:      Mental Status: He is lethargic.         Relevant Labs, Radiology, and Other Studies  CTA Brain/Neck 11/23/19:  1.  Extensive active extravasation from the wall of the right internal carotid artery in the distal C1 segment with a large 1.2 cm pseudoaneurysm projecting medially near the base of the skull. A large hematoma measuring 3.5 cm in the right nasopharynx  and multiple hematomas in the retropharyngeal space measuring 2.4 cm on the right and 1.5 cm on the left which extend from the level of the skull base to the level of the C3 vertebral body secondary to active extravasation. These are likely secondary to invasion of the right ICA due to erosive process involving the right carotid space and right skull base. Please refer to MRI done on same day for detailed description of multi spatial enhancement with intracranial extension. There is also right ICA invasion of the junction of the petrous and cavernous segment, as well as opacification of the cavernous sinus in the arterial phase. Emergent neurointerventional consult is recommended.   2.  Occlusion of right internal jugular vein near skull base.   3.  Extensive skull base destructive changes with involvement of skull base foramina is detailed on MRI report.    MR Face 11/23/19:  1. Findings concerning for right internal carotid artery invasion near the skull base with adjacent extensive hemorrhage suspicious for active extravasation. Multiple hemorrhagic lesions in the right nasopharynx and bilateral retropharyngeal space,.  Emergent CTA  head and neck is recommended .  2.  Extensive  multi-spatial enhancement with intracranial extension into the right middle and posterior cranial fossa. Extra-axial enhancing lesion overlies the right aspect of the brainstem in the region of the right cerebellopontine  angle.   Abnormal  enhancement in skull base including multiple skull base foramina including foramen ovale, rotundum and right cavernous sinus. Additional enhancement involving nasopharynx, right mastoid, clivus, parapharyngeal space and infratemporal fossa. These findings have progressed compared to outside CT dated 10/12/2019. Though progression raises question of infection, underlying mass lesion centred in the right nasopharynx/skull base  with superimposed multi-spatial infection is not excluded.   3. Likely short segment occlusion versus severe stenosis of the distal cervical right internal jugular vein and at the jugular bulb. Recommend attention on follow-up CTA. Junction of the petrous and cavernous segments of the right internal carotid artery suspicious for invasion. Attention on follow up CTA.   5.  Multistation cervical lymph node involvement as described above.       Procedures Performed and Complications  None    DISCHARGE INSTRUCTIONS  Discharge Diet  Not safe for swallowing  Tube feeds were stopped when extubated and wasn't tolerated them well with high residuals.    Functional Assessment at Discharge/Activity Goals  Only able to do activities in bed.     Allergies and Medications at Discharge    Allergies: Patient has no known allergies.    Your Medications at the End of This Hospitalization       Disp Refills Start End    fentaNYL, PF, (SUBLIMAZE) 50 mcg/mL injection 2 mL  12/02/2019     Sig - Route: Inject 0.5-1 mL (25-50 mcg total) into the vein every 30 (thirty) minutes as needed (severe pain) - Intravenous    Class: No Print    Earliest Fill Date: 12/02/2019    Cosign for Ordering: Accepted by Francesca Oman, MD CM on 12/02/2019  4:10 PM    Renewals     Renewal requests to authorizing provider Francesca Oman, MD CM) <b>prohibited</b>          hydrALAZINE (APRESOLINE) 20 mg/mL injection 1 mL  12/02/2019     Sig - Route: Inject 0.5 mL (10 mg total) into the vein Every 4 hours -  Intravenous    Class: No Print    Renewals     Renewal requests to authorizing provider Francesca Oman, MD CM) <b>prohibited</b>          HYDROmorphone (DILAUDID) 0.5 mg/0.5 mL injection   12/02/2019     Sig - Route: Inject 0.2-0.5 mL (0.2-0.5 mg total) into the vein every 2 (two) hours as needed (severe pain) - Intravenous    Class: No Print    Earliest Fill Date: 12/02/2019    Cosign for Ordering: Accepted by Francesca Oman, MD CM on 12/02/2019  4:10 PM    Renewals     Renewal requests to authorizing provider Francesca Oman, MD CM) <b>prohibited</b>              COVID TEST DAY OF DISCHARGE    Pending Tests  None    Outside Follow-up   He will receive hospice care closer to home.    Booked [place] Appointments  No future appointments.    Pending [facility] Referrals  None    Case Management Services Arranged  Case Management Services Arranged: (all recorded)     Outside [facility] Name         SNF/Subacute    Name of Agency/Facility      Address Type (copy to patient address)  [place], [place] [address]  ph: [phone]; fax: [phone].    [facility]      Zip Code      Phone Number      Fax Number      Authorization #      MD to MD discussion completed      RN to RN report phone number      [place]    SNF Waiver Used  None    Start Date for Services  12/03/19    Additional Instructions      testcatname      Street address [email or web address]      RETIRED: Address city, state, zip      Phone number      Fax number      Name  of facility      Street address      Name of facility (Retired)      Name of facility -Associate Professor address                  Discharge Assessment  Condition at discharge:  poor  Final Discharge Disposition: [facility] [place] (Hospice)    Covid-19 Vaccines     No immunizations on file.        Primary Care  Physician  Juluis Mire  Address: [address] / [place] [address]   Phone: [phone]  Fax: [phone]     Outside Providers, for pending tests please use the following numbers:   For [facility] - Please Call: [phone]    For [facility] - Please Call: [phone]   For [facility] - Please Call: [phone]    Signed,  Elias Else, MD  [facility]       Discharge Instructions provided to the patient (if any):      Patient Instructions    None

## 2019-12-02 NOTE — Nursing Note (Signed)
Pre Treatment Nursing Note  Pre treatment report received from Cristie Hem, RN  Pre treatment vitals: Wt 91.1 kg via Bed scale, BP 144/71, Pulse 73.  2 hours PUF started in Pt Room via R-AVF QB 300-350 ml/min, ordered UF 3 L as tolerated. 200 prime.    Medication Administration:  Aranesp: 0 mcg IVP  Zemplar: 0 mcg IVP  Blood Transfusion: No   After HD MAR reviewed and After PUF MAR: no medications due after PUF    Post Treatment Nursing Note  Post tx vitals: Wt 88.kg kg via Bed scale, BP 128/72, Pulse 69.   Pt tolerated 2 hr treatment with net UF 3 L fluid removed. Post treatment 15 bleeding time=15 minutes. Bruit yes & Thrill yes. no lidocaine. Treatment orders were not adjusted during the run.   There were no adverse reactions/complications during this procedure. Post treatment report given to: Cristie Hem, RN

## 2019-12-02 NOTE — Discharge Instructions (Signed)
limited to activities in bed

## 2019-12-02 NOTE — Consults (Signed)
NEPHROLOGY FOLLOW UP CONSULT NOTE     Events  Seen by ENT for epistaxis; no active bleeding    I personally examined the patient on ultrafiltration today.  Indication: ESRD; other volume overload  Prescription:  Treatment Type PUF Only   PUF Duration (Hrs) 2   UF goal for PUF (kg) 3   UF Profile for PUF None   Access Type AVF   BFR-As tolerated to a maximum of: 350 mL/min   Dialyzer F160 (83 mL)   Machine Lines Adult (108 mL)   Crit-line RN Discretion   Goal BP type SBP   Goal SBP> 90     Subjective  Intubated/sedated    Review of Systems    Review of systems unobtainable (pt intubated)    Vitals  Temp:  [36.4 C (97.5 F)-36.9 C (98.4 F)] 36.6 C (97.9 F)  Heart Rate:  [66-76] 73  *Resp:  [7-17] 10  BP: (120-163)/(57-83) 163/71  Arterial Line BP (mmHg) : (101-148)/(42-59) 148/52  FiO2 (%):  [28 %] 28 %  SpO2:  [95 %-100 %] 98 %    Most Recent Weight: 91.1 kg (200 lb 13.4 oz)  Admission Weight: (S) 84.4 kg (186 lb 1.1 oz)      Intake/Output Summary (Last 24 hours) at 12/02/2019 2349  Last data filed at 12/02/2019 2200  Gross per 24 hour   Intake 1513.92 ml   Output 3400 ml   Net -1886.08 ml       Current IP Medications  Scheduled Meds:   0.9% sodium chloride flush  3 mL Intravenous Q8H [place]    heparin flush  300 Units Intravenous Daily [place]    hydrALAZINE  10 mg Intravenous Q4H    meropenem  1,000 mg Intravenous Q PM [place]    pantoprazole  40 mg Intravenous Daily [place]    vancomycin  500 mg Intravenous After Hemodialysis     Continuous Infusions:    PRN Meds:   0.9% sodium chloride flush  3 mL Intravenous PRN    fentaNYL citrate  25-50 mcg Intravenous Q30 Min PRN    heparin flush  300 Units Intravenous PRN    lidocaine (PF)  5 mL Subcutaneous PRN       Physical Exam  General: critically ill, sedated  Eyes: normal conjunctivae, anicteric sclerae  ENMT: normal external nose and ears; mucous membranes moist; ETT in place  Respiratory: coarse upper airway sounds  Cardiovascular: regular rate & rhythm; normal s1/s2;  no rub  Gasterointestional: soft abdomen, non-tender, non-distended, positive active bowel sounds  Musculoskeletal: no clubbing/cyanosis, 2+ edema  Skin/Integumentary: no obvious rash  Neurologic: sedated  Psychiatric: sedated  Heme/lymphatic: no excessive bruising or bleeding  Dialysis access: RAVF    Data  All labs available in last 3 days in [facility] reviewed.          12/02/19  1238 12/02/19  0330 12/01/19  0340 12/01/19  0339 11/30/19  1416 11/30/19  1051 11/30/19  0305 11/30/19  0304   WBC 14.5*  --   --   --  10.7*  --   --   --    HCT 27.6*  --   --   --  26.8*  --   --   --    PLT 302  --   --   --  268  --   --   --    NA 131* 131* 129*  --   --   --  133*  --  K 3.8 3.8 4.1  --   --   --  3.9  --    CL 97* 97* 95*  --   --   --  97*  --    CO2 _0 --   --   --  25  --    BUN 27* 24 38*  --   --   --  29*  --    CREAT 3.99* 3.67* 4.97*  --   --   --  3.98*  --    GLU 135 139 117  --   --   --  123  --    CA 9.0 8.9 8.8  --   --   --  8.9  --    MG  --  2.0 2.0  --   --   --  2.1  --    PO4  --  3.8 4.8*  --   --   --  3.6  --    PH37  --   --   --  7.45  --  7.43  --  7.44   PCO2  --   --   --  40  --  42  --  44   PO2  --   --   --  91  --  87  --  94           Assessment and Recommendations  William Sherman is a 64 y.o. male [facility] HD TTS via AVF, diabetes, prior stroke,w/ right nasopharyngeal tumor who presents after high volume epistaxis with hemoglobin drop and requiring intubation for airway protection. Nonsurgical candidate, awaiting transfer back to a hospital closer to family's home prior to transitioning to hospice care.    End stage renal disease  Dialysis  -Maintenance dialysis TTS  -Volume status: volume overload    Plan 3L fluid removal with HD tomorrow    Blood Pressure  Had PUF today to assist with blood pressure control    Sodium  Sodium, Serum / Plasma   Date Value Ref Range Status   12/02/2019 131 (L) 135 - 145 mmol/L Final   restrict free water to 1 L daily    Potassium  Potassium,  Serum / Plasma   Date Value Ref Range Status   12/02/2019 3.8 3.5 - 5.0 mmol/L Final   satisfactory    Acid-Base  Carbon Dioxide, Total   Date Value Ref Range Status   12/02/2019 24 22 - 29 mmol/L Final   satisfactory    Anemia  Hemoglobin   Date Value Ref Range Status   12/02/2019 8.7 (L) 13.6 - 17.5 g/dL Final   adequate; recommend continuing Aranesp 100 g IV weekly    Mineral Metabolism  Phosphorus, Serum / Plasma   Date Value Ref Range Status   12/02/2019 3.8 2.3 - 4.7 mg/dL Final   satisfactory    Calcium, total, Serum / Plasma   Date Value Ref Range Status   12/02/2019 9.0 8.4 - 10.5 mg/dL Final   satisfactory    Nutrition  Albumin, Serum / Plasma   Date Value Ref Range Status   11/22/2019 2.1 (L) 3.4 - 4.8 g/dL Final   goal for protein intake should be at least 1.2 g/kg per day  He has a renal vitamin prescribed    Drug dosing  All medications reviewed    Medication dosing for ESRD consideration  - Dose medications per guidelines for patients receiving hemodialysis.   -  Pain control: Avoid morphine, Demerol. Preferable to use oxycodone,hydromorphone or fentanyl. Monitor closely for signs of opiate toxicity.   - Baclofen is contraindicated  - Cautious use of Gabapentin - max 347m/day.   - GERD: Avoid maalox, sucralfate and famotidine  - Constipation: Avoid Fleets enema. Okay to use tap water enema.       SJarome Matin GDocia FurlMD  12/02/19  Pager [phone]

## 2019-12-03 DIAGNOSIS — Z66 Do not resuscitate: Secondary | ICD-10-CM

## 2019-12-03 DIAGNOSIS — Z8673 Personal history of transient ischemic attack (TIA), and cerebral infarction without residual deficits: Secondary | ICD-10-CM

## 2019-12-03 DIAGNOSIS — R4701 Aphasia: Secondary | ICD-10-CM

## 2019-12-03 LAB — PHOSPHORUS, SERUM / PLASMA: Phosphorus, Serum / Plasma: 5.4 mg/dL — ABNORMAL HIGH (ref 2.3–4.7)

## 2019-12-03 LAB — BASIC METABOLIC PANEL (NA, K,
Anion Gap: 12 (ref 4–14)
Calcium, total, Serum / Plasma: 9.3 mg/dL (ref 8.4–10.5)
Carbon Dioxide, Total: 23 mmol/L (ref 22–29)
Chloride, Serum / Plasma: 97 mmol/L — ABNORMAL LOW (ref 101–110)
Creatinine: 4.62 mg/dL — ABNORMAL HIGH (ref 0.73–1.24)
Glucose, non-fasting: 88 mg/dL (ref 70–199)
Potassium, Serum / Plasma: 4.1 mmol/L (ref 3.5–5.0)
Sodium, Serum / Plasma: 132 mmol/L — ABNORMAL LOW (ref 135–145)
Urea Nitrogen, Serum / Plasma: 32 mg/dL — ABNORMAL HIGH (ref 7–25)
eGFR - high estimate: 15 mL/min — ABNORMAL LOW (ref >59)
eGFR - low estimate: 13 mL/min — ABNORMAL LOW (ref >59)

## 2019-12-03 LAB — MAGNESIUM, SERUM / PLASMA: Magnesium, Serum / Plasma: 2 mg/dL (ref 1.6–2.6)

## 2019-12-03 LAB — POCT GLUCOSE: Glucose, iSTAT: 94 mg/dL (ref 70–199)

## 2019-12-03 MED ORDER — SODIUM CHLORIDE 0.9 % (FLUSH) INJECTION SYRINGE
0.9 % | INTRAMUSCULAR | Status: DC
  Administered 2019-12-03 – 2019-12-05 (×8): 3 mL via INTRAVENOUS

## 2019-12-03 MED ORDER — GLYCOPYRROLATE 1 MG/5 ML (0.2 MG/ML) ORAL SOLUTION: 1 mg/5 mL (0.2 mg/mL) | ORAL | Status: DC

## 2019-12-03 MED ORDER — MORPHINE 2 MG/ML INJECTION SYRINGE (~~LOC~~ WRAPPER)
2 mg/mL | INTRAMUSCULAR | Status: DC | PRN
  Administered 2019-12-03 – 2019-12-05 (×14): 2 mg via INTRAVENOUS
  Administered 2019-12-05: 4 mg via INTRAVENOUS
  Administered 2019-12-05 (×2): 2 mg via INTRAVENOUS
  Administered 2019-12-06: 01:00:00 4 mg via INTRAVENOUS

## 2019-12-03 MED ORDER — MIDAZOLAM (PF) 1 MG/ML INJECTION SOLUTION
1 mg/mL | INTRAMUSCULAR | Status: DC | PRN
  Administered 2019-12-05 (×2): 1 mg via INTRAVENOUS

## 2019-12-03 MED ORDER — LABETALOL 5 MG/ML INTRAVENOUS SOLUTION
5 mg/mL | INTRAVENOUS | Status: AC
  Administered 2019-12-03: 09:00:00 10 mg via INTRAVENOUS

## 2019-12-03 MED ORDER — SODIUM CHLORIDE 0.9 % (FLUSH) INJECTION SYRINGE: 0.9 % | INTRAMUSCULAR | Status: DC | PRN

## 2019-12-03 MED FILL — FENTANYL (PF) 50 MCG/ML INJECTION SOLUTION: 50 mcg/mL | INTRAMUSCULAR | Qty: 2

## 2019-12-03 MED FILL — CUVPOSA 1 MG/5 ML (0.2 MG/ML) ORAL SOLUTION: 1 mg/5 mL (0.2 mg/mL) | ORAL | Qty: 1

## 2019-12-03 MED FILL — MORPHINE 2 MG/ML INJECTION SYRINGE: 2 mg/mL | INTRAMUSCULAR | Qty: 1

## 2019-12-03 MED FILL — HYDRALAZINE 20 MG/ML INJECTION SOLUTION: 20 mg/mL | INTRAMUSCULAR | Qty: 1

## 2019-12-03 MED FILL — MEROPENEM 1 GRAM INTRAVENOUS SOLUTION: 1 gram | INTRAVENOUS | Qty: 1000

## 2019-12-03 MED FILL — LABETALOL 5 MG/ML INTRAVENOUS SOLUTION: 5 mg/mL | INTRAVENOUS | Qty: 20

## 2019-12-03 NOTE — Interdisciplinary (Signed)
[facility] CARE SERVICES  Chaplain available on-call 24/7 by paging [phone] at [place] or [phone] at [place] is a 64 y.o. male     Demographics     Address Home Phone Work [employer]    [address],  [address] [place] [address] [phone]  [phone]    Social Security Number Insurance Information Marital Status Religion    [id number] MEDICARE Married             Chaplain attempted to visit with patient but he was unconscious.  Chaplain asked to be contacted by staff in the event that the patient's family arrives and is in need of support.     Donnal Debar    12/03/2019

## 2019-12-03 NOTE — Interdisciplinary (Signed)
[facility] CARE SERVICES  Chaplain available on-call 24/7 by paging [phone] at [place] or [phone] at [place] is a 64 y.o. male     Demographics     Address Home Phone Work [employer]    [place] [address],  [id number]  [place] [address] [phone]  [phone]    Social Security Number Insurance Information Marital Status Religion    [id number] MEDICARE Married             Chaplain visited with patient's daughter.  (The patient's wife- and daughter's mother- was speaking to family members on the phone.) Introduced self and [employer], and offered spiritual and emotional support to patient.    The patient's daughter stated that her brother was waiting in the downstairs lobby because he did not feel emotionally capable of seeing the patient right now. She shared that she also lost an Uncle in [place] only a few weeks ago. She feels well supported by her family members, and her hope is to enjoy Father's Day tomorrow with her dad.     Patients core spiritual need is assessed as: For developing strength and resilience. Chaplain will seek to address the following spiritual issues: Utilizing spiritual/religious resources to address issues of anticipatory grief and loss.     Chaplain affirmed the daughter's feelings of sadness and uncertainty, and offered a non-anxious, compassionate presence. Chaplain will refer this patient's care to Unit Chaplain to address patients continuing spiritual needs.      Donnal Debar    12/03/2019

## 2019-12-03 NOTE — Progress Notes (Signed)
[facility] Department of Critical Care Medicine  ICU Comfort Care Huddle Note     Providers Present for Comfort Care Huddle:  ICU Attending/Fellow  ICU NP/Resident  Bedside RN/Charge RN    The current code status is: DNAR/DNI    Huddle Discussion Included:  There is a plan for analgesia and sedation.  There is a consensus about removal of monitoring, equipment, therapies including extubation and discontinuation of mechanical ventilation if applicable, and plan to proceed.  There is a note reflecting the discussion with the family.   There is a plan for death in the ICU or transfer to [facility] if applicable.   All staff comfortable with the plan and have had opportunity to express concerns.   [facility] has been notified.       Notes:

## 2019-12-03 NOTE — Interdisciplinary (Addendum)
Patient still IP and did not DC at 06;30 am. Call placed to [facility], spoke with Guerry Minors who stated transport was cancelled when [facility] crew arrived on scene as patient determined to be too unstable.    Voalte placed to team and BS RN.    CM following    Anjali Manzella RNCCM-Neurosurgery    This weekend covering CM following this case/service today, Sat Only    Addendum 6/19 @ 09;25- Per team communication, when transport arrived onsite this am, it was determined by team that patient was not stable for a long ride to the accepting facility.    Patient transferred to comfort care in the ICU. Family alerted by team. Family arriving to the [facility] today.    Alerted Sat Supervisor Tammy at [facility] - [address], [place] [address]  ph: [phone].    Addendum 6/19 @ 14:00- Per team communication, patient is now doing better and team requesting patient to be transferred to [facility].    Call placed to [facility] and updated. The facility will consult with their team and update CM.    CM following    Addendum 6/19 @ 14:07- Per Tammy at [facility], supervising for the day and does not make admissions decisions re: this case, the facility now can not accept today  and the earliest they can reconsider is Monday. CM will need to call either Roselyn Reef or Butch Penny, the primary admissions team.    Updated primary team, who state they will proceed with comfort care at [facility] and follow up with CM.    Alerted CM manager, as instructed by CM handoff    This weekend covering CM following this case, today, Sat only

## 2019-12-03 NOTE — Progress Notes (Signed)
CRITICAL CARE PROGRESS NOTE       24 Hour Events:  - irregular and slow breathing this morning, did not feel that it would be safe for him to transport to SNF closer to home so cancelled transport  - transitioned to comfort care: discontinued labs, insulin sliding scale (ISS), hypertension medications, hemodialysis  - family made aware and plan to come this afternoon    Subjective:  - slow, irregular breathing with large breaths    Medications:   Scheduled Meds:   0.9% sodium chloride flush  3 mL Intravenous Q8H [place]    0.9% sodium chloride flush  3 mL Intravenous Q8H [place]    glycopyrrolate  0.2 mg Oral Once    heparin flush  300 Units Intravenous Daily [place]     Continuous Infusions:    PRN Meds:    0.9% sodium chloride flush  3 mL Intravenous PRN    0.9% sodium chloride flush  3 mL Intravenous PRN    fentaNYL citrate  25-50 mcg Intravenous Q30 Min PRN    heparin flush  300 Units Intravenous PRN    lidocaine (PF)  5 mL Subcutaneous PRN    midazolam  1 mg Intravenous Q10 Min PRN    morphine  2-4 mg Intravenous Q15 Min PRN       NEUROLOGIC  Exam: Opening eyes  CPOT: Total  Min: 0  Max: 5  Pain meds:   - Fentanyl PRN total 325 mcg /24 hours    - hydromorphone IV 0.2 mg /24 hours   RASS: Drowsy, Not fully alert, but has sustained (more than 10 seconds) awakening, with eye contact, to voice  Sedation meds: none  CAM-ICU: Positive    CARDIOVASCULAR  Pulse  Min: 65  Max: 75  Most recent: 70  BP  Min: 119/72  Max: 180/84  Most recent: (!) 175/83; MAP (mmHg)  Min: 86 mmHg  Max: 112 mmHg  Most recent: 111 mmHg  Vasoactives: stopped all 6/19 AM  Exam: RRR    No results found in last 72 hours    Temp:  [36.4 C (97.5 F)-36.7 C (98.1 F)] 36.6 C (97.9 F)  Heart Rate:  [65-75] 70  BP: (119-180)/(66-91) 175/83  Arterial Line BP (mmHg) : (146-148)/(49-52) 148/52  *Resp:  [7-17] 8  FiO2 (%):  [28 %] 28 %  SpO2:  [94 %-100 %] 100 %  O2 Device: Face tent  O2 Flow Rate (L/min):  [6 L/min] 6 L/min    RESPIRATORY:  Resp  Min:  7  Max: 17  Most recent: (!) 8  SpO2  Min: 94 %  Max: 100 %  Most recent: 100 %  Exam: face tent, humidified 6 lpm  Oxygen/ventilatory therapy:  FiO2 (%): 28 % (06/19 1000)  Recent Labs     12/01/19  0339   PH37 7.45   PCO2 40   PO2 91   HCO3 27   SAO2 98   FIO2 30.00     CXR:   No results found.    GASTROINTESTINAL  Exam: Soft, nontender, non distended  Diet: NPO Strict (No PO Meds) Effective Now     No results for input(s): TBILI, DBILI, AST, ALT, ALKP, GGT, ALB in the last 72 hours.    RENAL  Recent Labs     12/03/19  0419 12/02/19  1238 12/02/19  0330 12/01/19  0340   NA 132* 131* 131* 129*   K 4.1 3.8 3.8 4.1   CL 97* 97* 97*  95*   CO2 _0 BUN 32* 27* 24 38*   CREAT 4.62* 3.99* 3.67* 4.97*   GLU 88 135 139 117   CA 9.3 9.0 8.9 8.8   MG 2.0  --  2.0 2.0   PO4 5.4*  --  3.8 4.8*     Fluids (last 24 hours):    Intake/Output Summary (Last 24 hours) at 12/03/2019 1112  Last data filed at 12/03/2019 0214  Gross per 24 hour   Intake 490 ml   Output 3400 ml   Net -2910 ml     Last HD Friday    HEMATOLOGY  Recent Labs     12/02/19  1238 11/30/19  1416   HGB 8.7* 8.5*   HCT 27.6* 26.8*   PLT 302 268     Blood products (last 24 hours): None  Anticoagulation: None    INFECTIOUS DISEASE/IMMUNOLOGIC  Temp  Min: 36.4 C (97.5 F)  Max: 36.7 C (98.1 F)  Most recent: 36.6 C (97.9 F)  Recent Labs     12/02/19  1238 11/30/19  1416   WBC 14.5* 10.7*     Microbiology results:  Microbiology Results (last 72 hours)     ** No results found for the last 72 hours. **        Antimicrobials: S/p Meropenem, vancomycin (dialysis dosing), stopped 6/19    ENDOCRINE  No data recorded  Most recent: 140  Recent Labs     12/03/19  0419 12/02/19  1238 12/02/19  0330 12/01/19  0340   GLU 88 135 139 117     Insulin orders: stopped insulin sliding scale (ISS)    Imaging within last 24 hours:  No results found.    Pending Work-up:  Pending Labs     Order Current Status    Add On Differential In process          ASSESSMENT/PLAN:  William Sherman  is a 64 y.o. 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 No ENT or NIR intervention possible, and patient has been transitioned to DNAR/DNI.    Unfortunately he deteriorated on 6/19 and it was not safe for him to transfer to skilled nursing facility closer to patient's family. He has transitioned to comfort care and his family is coming to be with him here today.    NEURO:  # Pain / Dyspnea  - morphine 2-4 mg PRN    # Anxiety / Agiation  - midazolam 33m PRN    CV:   # (resolved) SBP < 140 in setting of internal carotid bleeding  # HTN  Relaxing goal in setting of comfort care.  - Stop antihypertensives    RESP:   # Respiratory Insufficiency  Has been able to maintain his airway since extubation. Though he is now having slowed, irregular breathing.  - Continue humidified oxygen for comfort  - Glycopyrrolate if needed to decrease secretions    GI:   # NPO  No route for enteral feeds and not safe to swallow.    RENAL:   # ESRD on iHD  - No plan for ongoing HD in setting of comfort care.  - Stop checking labs     HEME:   #Concern for ongoing bleeding  - ENT packing in place  - Stop checking CBC in setting of comfort care  ID:   # Skull base osteomyelitis  S/p vanc, meropenem in setting of comfort care    ENDO:   # DMT2  - Stop insulin sliding scale (ISS) .sio comfort care    ICU BUNDLE  Code Status: DNAR/DNI      Patient Lines/Drains/Airways Status    Active LDAs     Name:   Placement date:   Placement time:   Site:   Days:    Peripheral IV 11/21/19 Anterior;Right External Jugular   11/21/19        External Jugular   12    Peripheral IV 12/02/19 Anterior;Left Saphenous   12/02/19    1341    Saphenous   less than 1    Arteriovenous Access AV Fistula Right;Upper Arm                Arteriovenous Access AV Fistula Left;Lower Arm                Pressure Injury  (Ulcer) 11/28/19 Coccyx Deep Tissue Pressure Injury (ulcer)   11/28/19    0806     5    Wound 11/22/19 Other (Comment) Arm Distal;Right;Upper;Anterior   11/22/19    1731    Arm   10              PT/OT: No  DVT prophylaxis: None  Stress Ulcer prophylaxis: None  Diet: NPO    Bowel regimen: None  Dispo: can transfer to the floor after family arrives if he does not die within the day.    I discussed the care of this patient with ICU attending, Dr. Albesa Seen.     Please contact the Critical Care Service with questions:  13 ICU: ph [phone], pgr [phone]    Bunnie Philips  12/03/19  Critical Care Medicine Service

## 2019-12-04 MED FILL — MORPHINE 2 MG/ML INJECTION SYRINGE: 2 mg/mL | INTRAMUSCULAR | Qty: 1

## 2019-12-04 NOTE — Progress Notes (Signed)
CRITICAL CARE PROGRESS NOTE       24 Hour Events:  - transitioned to comfort care with morphine boluses PRN, stopped labs, stopped anti-hypertensives  - breathing remains slow, on humidified face tent with 6 lpm oxygen  - family visited    Subjective:  - slow breathing, appears comfortable    Medications:   Scheduled Meds:   0.9% sodium chloride flush  3 mL Intravenous Q8H [place]    0.9% sodium chloride flush  3 mL Intravenous Q8H [place]    glycopyrrolate  0.2 mg Oral Once    heparin flush  300 Units Intravenous Daily [place]     Continuous Infusions:    PRN Meds:    0.9% sodium chloride flush  3 mL Intravenous PRN    0.9% sodium chloride flush  3 mL Intravenous PRN    heparin flush  300 Units Intravenous PRN    lidocaine (PF)  5 mL Subcutaneous PRN    midazolam  1 mg Intravenous Q10 Min PRN    morphine  2-4 mg Intravenous Q15 Min PRN       NEUROLOGIC  Exam: Opening eyes  CPOT: Total  Min: 0  Max: 1  Pain meds:   - morphine 4m PRN: receiving 2 mg approximately every 1-2 hours  RASS: Drowsy, Not fully alert, but has sustained (more than 10 seconds) awakening, with eye contact, to voice  Sedation meds: none  CAM-ICU: Positive    CARDIOVASCULAR  Pulse  Min: 67  Max: 76  Most recent: 74  BP  Min: 146/74  Max: 179/83  Most recent: (!) 146/74; MAP (mmHg)  Min: 93 mmHg  Max: 112 mmHg  Most recent: 96 mmHg  Vasoactives: stopped all 6/19 AM  Exam: RRR    No results found in last 72 hours    Temp:  [36.5 C (97.7 F)] 36.5 C (97.7 F)  Heart Rate:  [67-76] 74  BP: (146-179)/(74-88) 146/74  *Resp:  [6-15] 8  FiO2 (%):  [28 %] 28 %  SpO2:  [88 %-100 %] 90 %  O2 Flow Rate (L/min):  [6 L/min] 6 L/min    RESPIRATORY:  Resp  Min: 6  Max: 15  Most recent: (!) 8  SpO2  Min: 88 %  Max: 100 %  Most recent: 90 %  Exam: face tent, humidified 6 lpm  Oxygen/ventilatory therapy:  FiO2 (%): 28 % (06/20 1000)  No results for input(s): PH37, PCO2, PO2, HCO3, SAO2, FIO2 in the last 72 hours.  CXR:   No results found.    GASTROINTESTINAL   Exam: deferred exam  Diet: NPO Strict (No PO Meds) Effective Now     No results for input(s): TBILI, DBILI, AST, ALT, ALKP, GGT, ALB in the last 72 hours.    RENAL  Recent Labs     12/03/19  0419 12/02/19  1238 12/02/19  0330   NA 132* 131* 131*   K 4.1 3.8 3.8   CL 97* 97* 97*   CO2 _0 BUN 32* 27* 24   CREAT 4.62* 3.99* 3.67*   GLU 88 135 139   CA 9.3 9.0 8.9   MG 2.0  --  2.0   PO4 5.4*  --  3.8     Fluids (last 24 hours):  No intake or output data in the 24 hours ending 12/04/19 1040  Last HD Friday    HEMATOLOGY  Recent Labs     12/02/19  1238   HGB 8.7*  HCT 27.6*   PLT 302     Blood products (last 24 hours): None  Anticoagulation: None    INFECTIOUS DISEASE/IMMUNOLOGIC  Temp  Min: 36.5 C (97.7 F)  Max: 36.5 C (97.7 F)  Most recent: 36.5 C (97.7 F)  Recent Labs     12/02/19  1238   WBC 14.5*     Microbiology results:  Microbiology Results (last 72 hours)     ** No results found for the last 72 hours. **        Antimicrobials: S/p Meropenem, vancomycin (dialysis dosing), stopped 6/19    ENDOCRINE  No data recorded  Most recent: 140  Recent Labs     12/03/19  0419 12/02/19  1238 12/02/19  0330   GLU 88 135 139     Insulin orders: stopped insulin sliding scale (ISS)    Imaging within last 24 hours:  No results found.    Pending Work-up:  Pending Labs     Order Current Status    Add On Differential In process          ASSESSMENT/PLAN:  William Sherman is a 64 y.o. 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 No ENT or NIR intervention possible, and patient has been transitioned to DNAR/DNI.    Unfortunately he deteriorated on 6/19 and it was not safe for him to transfer to skilled nursing facility closer to patient's family. He has transitioned to comfort care and his family is coming to be with him here.    6/20: Awaiting availability of bed on the  floor with the inpatient comfort care team.    NEURO:  # Pain / Dyspnea  - morphine 2-4 mg PRN    # Anxiety / Agiation  - midazolam 52m PRN    CV:   # (resolved) SBP < 140 in setting of internal carotid bleeding  # HTN  Relaxing goal in setting of comfort care.  - Stop antihypertensives    RESP:   # Respiratory Insufficiency  Has been able to maintain his airway since extubation. Though he is now having slowed, irregular breathing.  - Continue humidified oxygen for comfort  - Glycopyrrolate if needed to decrease secretions    GI:   # NPO  No route for enteral feeds and not safe to swallow.    RENAL:   # ESRD on iHD  - No plan for ongoing HD in setting of comfort care.  - Stop checking labs     HEME:   #Concern for ongoing bleeding  - ENT packing in place  - Stop checking CBC in setting of comfort care    ID:   # Skull base osteomyelitis  S/p vanc, meropenem in setting of comfort care    ENDO:   # DMT2  - Stop insulin sliding scale (ISS) .sio comfort care    ICU BUNDLE  Code Status: DNAR/DNI      Patient Lines/Drains/Airways Status    Active LDAs     Name:   Placement date:   Placement time:   Site:   Days:    Peripheral IV 11/21/19 Anterior;Right External Jugular   11/21/19        External Jugular   13    Peripheral IV 12/02/19 Anterior;Left Saphenous   12/02/19    1341    Saphenous   1  Arteriovenous Access AV Fistula Right;Upper Arm                Arteriovenous Access AV Fistula Left;Lower Arm                Pressure Injury (Ulcer) 11/28/19 Coccyx Deep Tissue Pressure Injury (ulcer)   11/28/19    0806     6    Wound 11/22/19 Other (Comment) Arm Distal;Right;Upper;Anterior   11/22/19    1731    Arm   11              PT/OT: No  DVT prophylaxis: None  Stress Ulcer prophylaxis: None  Diet: NPO    Bowel regimen: None  Dispo: can transfer to the floor after family arrives if he does not die within the day.    I discussed the care of this patient with ICU attending, Dr. Albesa Seen.     Please contact the Critical  Care Service with questions:  13 ICU: ph [phone], pgr [phone]    Bunnie Philips  12/04/19  Critical Care Medicine Service

## 2019-12-04 NOTE — Consults (Signed)
NEPHROLOGY FOLLOW UP CONSULT NOTE     Events  transition to comfort care noted     Subjective  nonverbal    Review of Systems    Review of systems unobtainable    Vitals  Temp:  [36.5 C (97.7 F)-36.6 C (97.9 F)] 36.5 C (97.7 F)  Heart Rate:  [65-76] 76  *Resp:  [6-16] 7  BP: (119-180)/(72-91) 146/74  FiO2 (%):  [28 %] 28 %  SpO2:  [88 %-100 %] 96 %    Most Recent Weight: 91.1 kg (200 lb 13.4 oz)  Admission Weight: (S) 84.4 kg (186 lb 1.1 oz)    No intake or output data in the 24 hours ending 12/04/19 0617    Current IP Medications  Scheduled Meds:   0.9% sodium chloride flush  3 mL Intravenous Q8H [place]    0.9% sodium chloride flush  3 mL Intravenous Q8H [place]    glycopyrrolate  0.2 mg Oral Once    heparin flush  300 Units Intravenous Daily [place]     Continuous Infusions:    PRN Meds:   0.9% sodium chloride flush  3 mL Intravenous PRN    0.9% sodium chloride flush  3 mL Intravenous PRN    heparin flush  300 Units Intravenous PRN    lidocaine (PF)  5 mL Subcutaneous PRN    midazolam  1 mg Intravenous Q10 Min PRN    morphine  2-4 mg Intravenous Q15 Min PRN       Physical Exam  General: critically ill, sedated  Eyes: normal conjunctivae, anicteric sclerae  ENMT: normal external nose and ears; mucous membranes moist; ETT in place  Respiratory: coarse upper airway sounds  Cardiovascular: regular rate & rhythm; normal s1/s2; no rub  Gasterointestional: soft abdomen, non-tender, non-distended, positive active bowel sounds  Musculoskeletal: no clubbing/cyanosis, 2+ edema  Skin/Integumentary: no obvious rash  Neurologic: sedated  Psychiatric: sedated  Heme/lymphatic: no excessive bruising or bleeding  Dialysis access: RAVF    Data  All labs available in last 3 days in [facility] reviewed.          12/03/19  0419 12/02/19  1238 12/02/19  0330   WBC  --  14.5*  --    HCT  --  27.6*  --    PLT  --  302  --    NA 132* 131* 131*   K 4.1 3.8 3.8   CL 97* 97* 97*   CO2 _0 BUN 32* 27* 24   CREAT 4.62* 3.99* 3.67*   GLU  88 135 139   CA 9.3 9.0 8.9   MG 2.0  --  2.0   PO4 5.4*  --  3.8           Assessment and Recommendations  William Sherman is a 64 y.o. male [facility] TTS via AVF, diabetes, prior stroke,w/ right nasopharyngeal tumor who presents after high volume epistaxis with hemoglobin drop and requiring intubation for airway protection. Nonsurgical candidate, awaiting transfer back to a hospital closer to family's home prior to transitioning to hospice care.    End stage renal disease  Dialysis  dialysis to be discontinued in view of transition to comfort measures only      Nephrology Consult team will sign off for now.  Thank you for asking [place] to assist in his care.      Jarome Matin. Docia Furl MD  12/03/19  Pager [phone]

## 2019-12-05 DIAGNOSIS — R0689 Other abnormalities of breathing: Secondary | ICD-10-CM

## 2019-12-05 DIAGNOSIS — R451 Restlessness and agitation: Secondary | ICD-10-CM

## 2019-12-05 LAB — POCT COVID-19 RNA, QUALITATIVE: POCT COVID-19 RNA, Qualitative: NOT DETECTED

## 2019-12-05 LAB — COVID-19 RNA, RT-PCR/NUCLEIC A: COVID-19 RNA, RT-PCR/Nucleic A: NOT DETECTED

## 2019-12-05 MED FILL — MORPHINE 2 MG/ML INJECTION SYRINGE: 2 mg/mL | INTRAMUSCULAR | Qty: 1

## 2019-12-05 MED FILL — MORPHINE 2 MG/ML INJECTION SYRINGE: 2 mg/mL | INTRAMUSCULAR | Qty: 2

## 2019-12-05 MED FILL — HEPARIN LOCK FLUSH (PORCINE) (PF) 100 UNIT/ML INTRAVENOUS SYRINGE: 100 unit/mL | INTRAVENOUS | Qty: 3

## 2019-12-05 MED FILL — MIDAZOLAM (PF) 1 MG/ML INJECTION SOLUTION: 1 mg/mL | INTRAMUSCULAR | Qty: 2

## 2019-12-05 NOTE — Progress Notes (Signed)
CRITICAL CARE PROGRESS NOTE       24 Hour Events:  - ongoing comfort care with morphine boluses PRN  - Case manager - no hospice bed availabilities near family for at least two weeks  - In-patient hospice service - no availabilities for today     Subjective:  - slow breathing, appears comfortable    Medications:   Scheduled Meds:   0.9% sodium chloride flush  3 mL Intravenous Q8H [place]    0.9% sodium chloride flush  3 mL Intravenous Q8H [place]    glycopyrrolate  0.2 mg Oral Once    heparin flush  300 Units Intravenous Daily [place]     Continuous Infusions:    PRN Meds:    0.9% sodium chloride flush  3 mL Intravenous PRN    0.9% sodium chloride flush  3 mL Intravenous PRN    heparin flush  300 Units Intravenous PRN    lidocaine (PF)  5 mL Subcutaneous PRN    midazolam  1 mg Intravenous Q10 Min PRN    morphine  2-4 mg Intravenous Q15 Min PRN       NEUROLOGIC  Exam: Opening eyes  CPOT: Total  Min: 0  Max: 3  Pain meds:   - morphine 48m PRN: received 10 mg over 24 hours   RASS: Drowsy, Not fully alert, but has sustained (more than 10 seconds) awakening, with eye contact, to voice  Sedation meds: none  CAM-ICU: Unable to Assess; RASS -4 or -5    CARDIOVASCULAR  No data recorded  Most recent: 74  No data recorded  Most recent: (!) 146/74; No data recorded  Most recent: 96 mmHg  Vasoactives: stopped all 6/19 AM  Exam: RRR    No results found in last 72 hours    *Resp:  [9-13] 10  FiO2 (%):  [28 %] 28 %  SpO2:  [98 %] 98 %  O2 Device: Trach mask  O2 Flow Rate (L/min):  [6 L/min] 6 L/min    RESPIRATORY:  Resp  Min: 9  Max: 13  Most recent: (!) 10  SpO2  Min: 98 %  Max: 98 %  Most recent: 98 %  Exam: face tent, humidified 6 lpm  Oxygen/ventilatory therapy:  FiO2 (%): 28 % (06/21 1100)  No results for input(s): PH37, PCO2, PO2, HCO3, SAO2, FIO2 in the last 72 hours.  CXR:   No results found.    GASTROINTESTINAL  Exam: deferred exam  Diet: NPO Strict (No PO Meds) Effective Now     No results for input(s): TBILI, DBILI, AST,  ALT, ALKP, GGT, ALB in the last 72 hours.    RENAL  Recent Labs     12/03/19  0419 12/02/19  1238   NA 132* 131*   K 4.1 3.8   CL 97* 97*   CO2 23 24   BUN 32* 27*   CREAT 4.62* 3.99*   GLU 88 135   CA 9.3 9.0   MG 2.0  --    PO4 5.4*  --      Fluids (last 24 hours):  No intake or output data in the 24 hours ending 12/05/19 1225  Last HD Friday    HEMATOLOGY  Recent Labs     12/02/19  1238   HGB 8.7*   HCT 27.6*   PLT 302     Blood products (last 24 hours): None  Anticoagulation: None    INFECTIOUS DISEASE/IMMUNOLOGIC  No data recorded  Most recent: 36.5  C (97.7 F)  Recent Labs     12/02/19  1238   WBC 14.5*     Microbiology results:  Microbiology Results (last 72 hours)     ** No results found for the last 72 hours. **        Antimicrobials: S/p Meropenem, vancomycin (dialysis dosing), stopped 6/19    ENDOCRINE  No data recorded  Most recent: 140  Recent Labs     12/03/19  0419 12/02/19  1238   GLU 88 135     Insulin orders: stopped insulin sliding scale (ISS)    Imaging within last 24 hours:  No results found.    Pending Work-up:  Pending Labs     Order Current Status    Add On Differential In process          ASSESSMENT/PLAN:  Prophet Renwick is a 64 y.o. 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 No ENT or NIR intervention possible, and patient has been transitioned to DNAR/DNI.    Unfortunately he deteriorated on 6/19 and it was not safe for him to transfer to skilled nursing facility closer to patient's family. He has transitioned to comfort care and his family is coming to be with him here.    6/20: Awaiting availability of bed on the floor with the inpatient comfort care team.  6/21: No availabilities with in-patient hospice today, and unfortunately there will be no availabilities with hospice facilities near family in [place] for two weeks. Will  discuss transfer to ENT or medicine service to facilitate comfort suite.     NEURO:  # Pain / Dyspnea  - morphine 2-4 mg PRN    # Anxiety / Agiation  - midazolam 49m PRN    CV:   # (resolved) SBP < 140 in setting of internal carotid bleeding  # HTN  Relaxing goal in setting of comfort care.  - Stop antihypertensives    RESP:   # Respiratory Insufficiency  Has been able to maintain his airway since extubation. Though he is now having slowed, irregular breathing.  - Continue humidified oxygen for comfort  - Glycopyrrolate if needed to decrease secretions    GI:   # NPO  No route for enteral feeds and not safe to swallow.    RENAL:   # ESRD on iHD  - No plan for ongoing HD in setting of comfort care.  - Stop checking labs     HEME:   #Concern for ongoing bleeding  - ENT packing in place  - Stop checking CBC in setting of comfort care    ID:   # Skull base osteomyelitis  S/p vanc, meropenem in setting of comfort care    ENDO:   # DMT2  - Stop insulin sliding scale (ISS) .sio comfort care    ICU BUNDLE  Code Status: DNAR/DNI      Patient Lines/Drains/Airways Status    Active LDAs     Name:   Placement date:   Placement time:   Site:   Days:    Peripheral IV 11/21/19 Anterior;Right External Jugular   11/21/19        External Jugular   14    Peripheral IV 12/02/19 Anterior;Left Saphenous   12/02/19    1341    Saphenous   2    Arteriovenous Access AV Fistula Right;Upper Arm  Arteriovenous Access AV Fistula Left;Lower Arm                Pressure Injury (Ulcer) 11/28/19 Coccyx Deep Tissue Pressure Injury (ulcer)   11/28/19    0806     7    Wound 11/22/19 Other (Comment) Arm Distal;Right;Upper;Anterior   11/22/19    1731    Arm   12              PT/OT: No  DVT prophylaxis: None  Stress Ulcer prophylaxis: None  Diet: NPO    Bowel regimen: None  Dispo: can transfer to the floor after family arrives if he does not die within the day.    I discussed the care of this patient with ICU attending, Dr. Albesa Seen.      Please contact the Critical Care Service with questions:  13 ICU: ph [phone], pgr [phone]    Cassandria Santee  12/05/19  Critical Care Medicine Service

## 2019-12-05 NOTE — Interdisciplinary (Signed)
Received a call from Lowella Grip [facility] [phone] regarding the transport for this patient to [place]. Given the later timing, and distance for this transport, attempts were made to contact [facility] to confirm staff availability for admission. Initial attempts for contact were unsuccessful.    This Probation officer was able to make contact to [facility] [phone] and confirmed with Charge RN Altha Harm that patient is still being accepted, and there is staff available to receive his admission at approximately 0100.     Patient is on comfort care, receiving supplemental oxygen for comfort. Transport confirmed again w/ [facility] ground crew, [facility] Biomedical scientist, [facility] Best boy, Psychiatric nurse, ED Agricultural consultant and ED Social Work.     CM will confirm arrival in AM shift.    Rush Landmark, RN MS  Director of Care Mgmt and Pt Transitions

## 2019-12-05 NOTE — Nursing Note (Signed)
Patient had left unit on facemask with O2 sats hard to read. AMS reported O2 sat in 70's and hesitant to take patient to SNF. Report was called to SNF by dayshift and were made aware of patient status. Both family and SNF in agreement to transfer patient with the possibility of patient passing en-route. High level of social work involved in getting patient to transfer as concern for oxygen status arose from AMS. All parties are in agreement to transfer patient to SNF at this time and all are made aware of risk during transport. Patient is DNAR/DNI

## 2019-12-05 NOTE — Interdisciplinary (Addendum)
Emergency Department RN Case Management SBAR      Situation (referral source/request, CM consult received from):  CM retrieved VM on main ED line that was left by 13 ICU BS RN Vaughan Basta (ext [phone]) at about 1928 on 6/21 indicating wrong dc paperwork was included in dc packet and [facility] ambulance was already on unit to p/u pt and transfer to OSH/SNF.        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 Patient received 1 unit pRBC, KCentra 1,000 Units, and bleeding controlled with bilateral rhinorocket nasal packing.       Assessment (recent/pertinent CMA info related to situation):  CM follow up on VM that was received.      Recommendation (e.g. inpatient vs d/c, hold in ED/CDU for rehab eval, etc):   Pt's name/MRN provided via email to this CM via [place].    Per chart review of Director [facility] note, CM to confirm pt's arrival this AM.    CM called [phone] ([facility]) and s/w Izora Gala (Admissions coordinator [phone] extension [phone]).  CM called to confirm if pt arrived safely at SNF.  Izora Gala reports that pt was deceased in route to SNF and that SNF did not receive any report from ambulance company.  Izora Gala requested name/# for ambulance company.  CM provided Izora Gala w/ name/# for Lowella Grip [employer] [phone] which was left in [facility] note.  CM also informed Izora Gala that CM will send email to Director Cloyde Reams and Soil scientist and Jori Moll about this pt's transfer and that [place], [place], or Jori Moll will reach out to [place].  Izora Gala verbalized understanding.        No other CM needs at this time.    Margaretha Sheffield, RN, BSN (following up on retrieved VM)  Emergency Department Nurse Case Manager  [phone])  Office [phone]   [employer] phone [phone]    [facility]  [address] [place] [address]

## 2019-12-05 NOTE — Interdisciplinary (Signed)
CASE MANAGEMENT DISCHARGE        ADULT CASE MANAGEMENT DISCHARGE (most recent)      Discharge Note Flowsheet - 11/21/19        Final Discharge Note    *Primary Case Manager (First and Last Name)  Marjory Sneddon RN, Wade     Final Discharge Disposition  [facility]    Skilled or Acute needs  Nursing    SW    Following Provider Name  Mart Piggs, MD     Following Provider Phone  [phone]     Patient Choice  Choice of providers discussed with patient and/or designee.  Patient, family or legal decision maker, and team are in agreement with this discharge plan     CD images provided for treatment purposes?  No     Parent/Surrogate Decision Maker agrees with plan  Yes        SNF/Subacute    Name of Agency/Facility  --    [facility], [place] [address]  ph: [phone]; fax: [phone]    Address Type (copy to patient address)  Temporary     RN to RN report phone number  Main number ph: [phone]     Services Arranged  Skilled Nursing    SW    SNF Waiver Used  None     Start Date for Services  12/05/19        Transportation Arrangements    Date of Transport  12/05/19     Time of Transport  1900     Transportation arrangements  Case Management/Social Work coordinated Research officer, political party  [facility] ([place]  [place]    [place] will be billed for transportation services     Additional Instructions  PCS form completed and included in transfer packet.       SNF transfer packet updated with latest transfer orders and discharge summary. Transfer packet faxed to [facility] at fax: [phone]

## 2019-12-05 NOTE — Nursing Note (Signed)
Patient left with transport to SNF at this time. Patient on comfort care and left on 6L Facemask SPO2 at 91%. No belongings at the bedside.

## 2019-12-06 MED FILL — MORPHINE 2 MG/ML INJECTION SYRINGE: 2 mg/mL | INTRAMUSCULAR | Qty: 2

## 2020-05-15 NOTE — Progress Notes (Signed)
Reviewing patient's records. Noted patient was admitted 11/2019 due to  severe epistaxis. 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 ENT and neuro IR decided that no meaningful  intervention given high morbidity. Family decided to pursue focus on  comfort care and patient was discharged to SNF. Unable to reach dialysis  unit or Nephrology's office to inquire about patient's status.    PLAN: Request for PC to follow-up on status.  Electronically entered by: [name] on 05/15/2020 2:12:00 PM

## 2020-05-16 NOTE — Progress Notes (Addendum)
Confirmed with Network [facility] that patient expired Jan 02, 2020 - DOB/SSN confirmed  as patient identifiers.  No cause given  Data center, [facility] and [employer] notified.  Electronically entered by: Gerrianne Scale on 05/16/2020 11:05:00 AM

## 2020-05-16 NOTE — Progress Notes (Signed)
RE: EXPIRED  Pt has been expired in [place], [place], and [employer].  Electronically entered by: Vidal Schwalbe on 05/16/2020 12:04:00 PM

## 2022-08-19 IMAGING — MR RM - PELVE
19 of 21 series · 42 of 48 positions shown · non-contrast
Comparison: none

[Series 2: T2 · axial · 5.0mm · 0.62mm/px · 1 of 36 slices shown (1 of 6)]
[im 1/36]
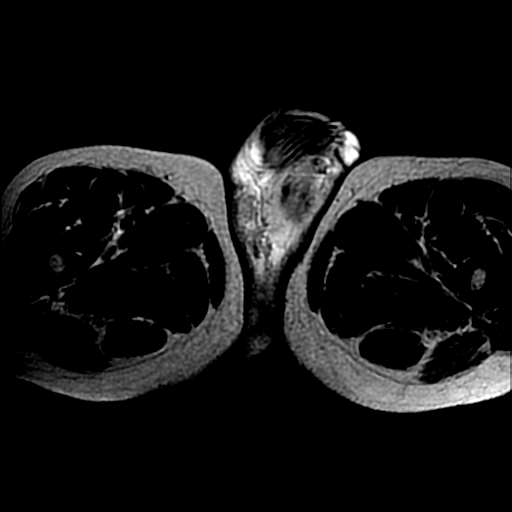

[Series 3: T1 · axial · 5.0mm · 0.62mm/px · 1 of 18 slices shown (1 of 2)]
[im 1/18]
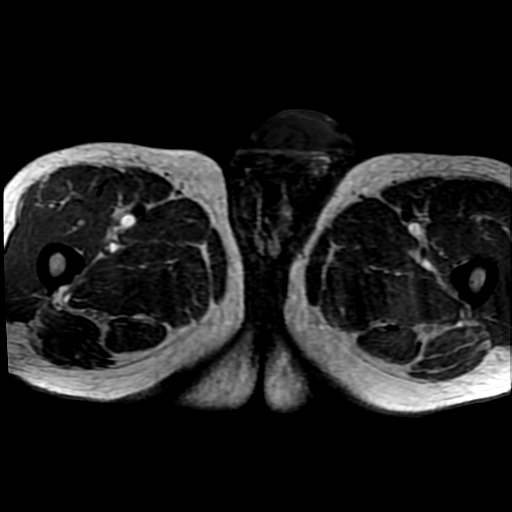

[Series 4: T1 · axial · 5.0mm · 0.62mm/px · 1 of 36 slices shown (2 of 2)]
[im 1/36]
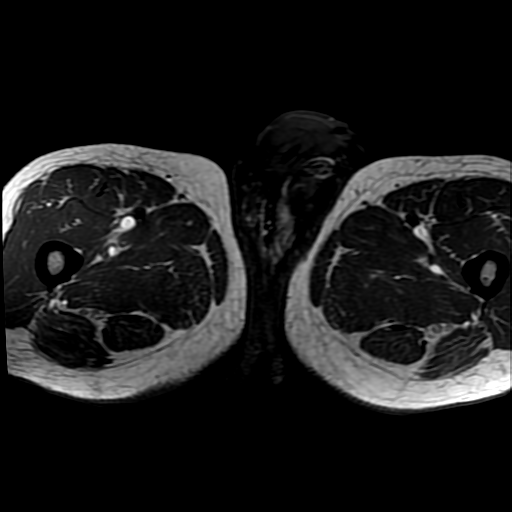

[Series 5: T2 · sagittal · 3.0mm · 0.39mm/px · 1 of 20 slices shown (2 of 6)]
[im 1/20]
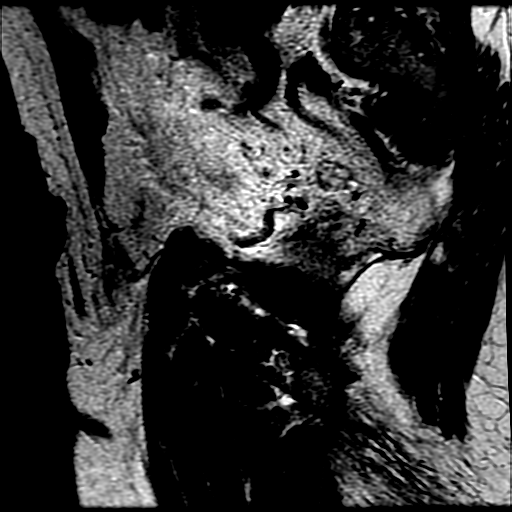

[Series 6: T2 · coronal · 3.0mm · 0.39mm/px · 1 of 22 slices shown (3 of 6)]
[im 1/22]
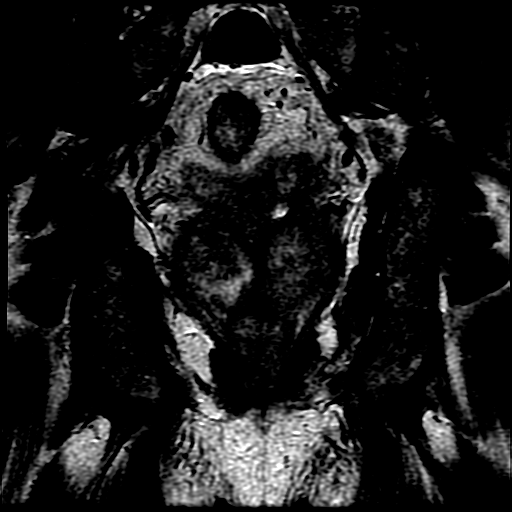

[Series 7: ax difusão · axial · 5.0mm · 1.25mm/px · 1 of 72 slices shown]
[im 1/72]
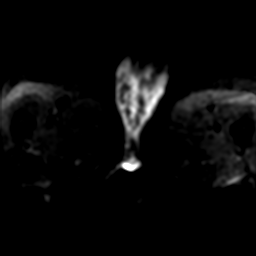

[Series 8: T2 · sagittal · 3.0mm · 0.39mm/px · 1 of 22 slices shown (4 of 6)]
[im 1/22]
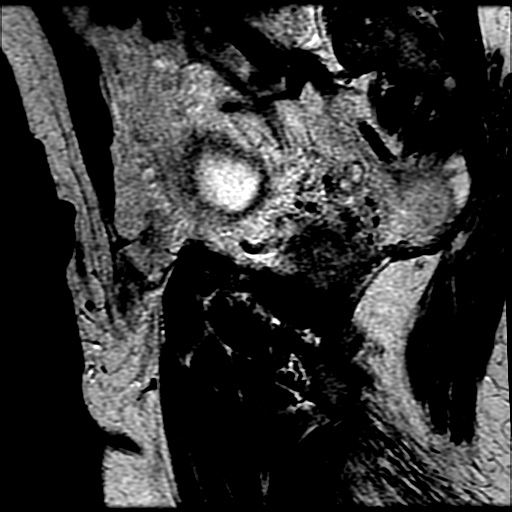

[Series 9: T2 · axial · 3.3mm · 0.35mm/px · 1 of 25 slices shown (5 of 6)]
[im 1/25]
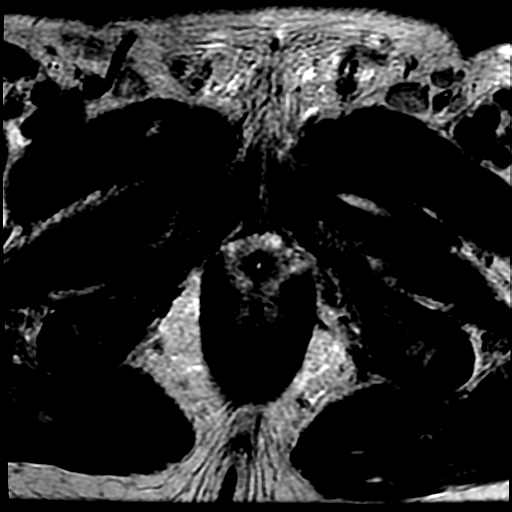

[Series 10: DWI · axial · 3.3mm · 0.70mm/px · 1 of 50 slices shown (1 of 2)]
[im 1/50]
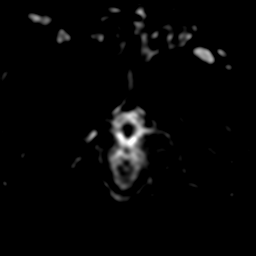

[Series 11: DWI · axial · 3.3mm · 0.70mm/px · 1 of 50 slices shown (2 of 2)]
[im 1/50]
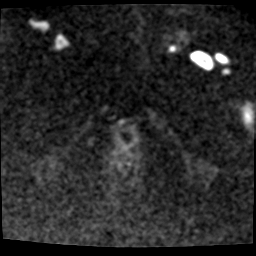

[Series 12: (person_name): perfusao prostata · axial · 4.0mm · 1.09mm/px · z∈[-94,-8]mm · 14 of 880 slices shown]
[im 1/880]
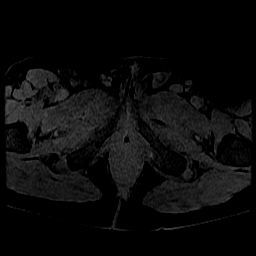
[im 68/880]
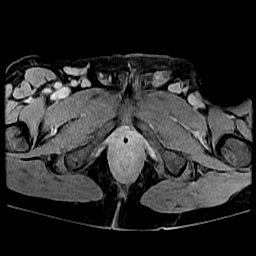
[im 136/880]
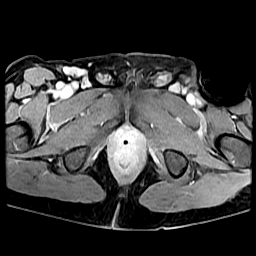
[im 203/880]
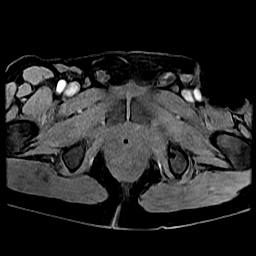
[im 271/880]
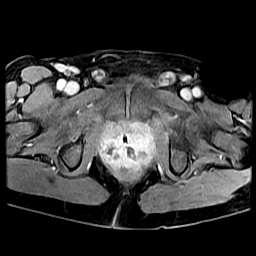
[im 339/880]
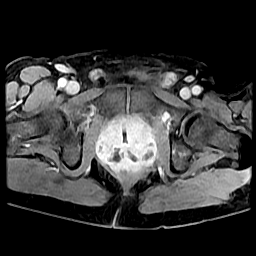
[im 406/880]
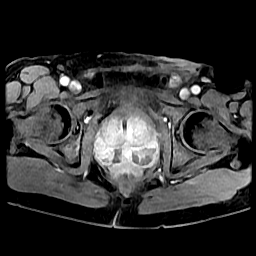
[im 474/880]
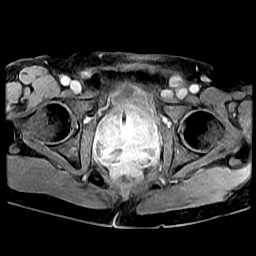
[im 541/880]
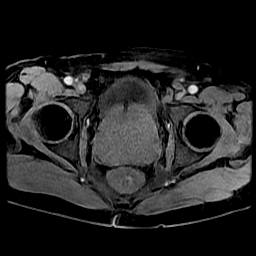
[im 609/880]
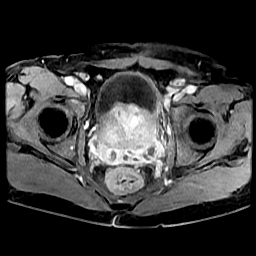
[im 677/880]
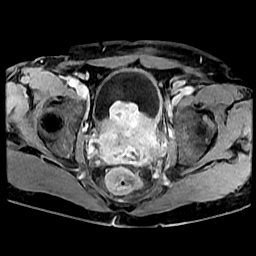
[im 744/880]
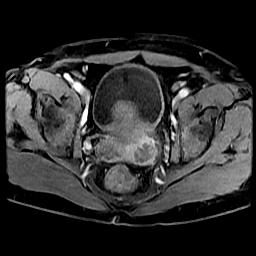
[im 812/880]
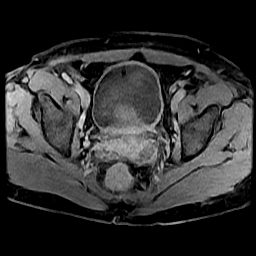
[im 880/880]
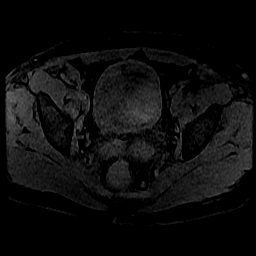

[Series 750: ADC · axial · 5.0mm · 1.25mm/px · 1 of 36 slices shown (1 of 3)]
[im 1/36]
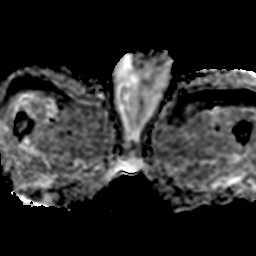

[Series 751: eadc · axial · 5.0mm · 1.25mm/px · 1 of 36 slices shown (1 of 3)]
[im 1/36]
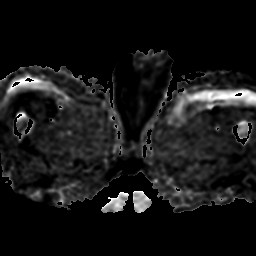

[Series 1050: ADC · axial · 3.3mm · 0.70mm/px · 1 of 25 slices shown (2 of 3)]
[im 1/25]
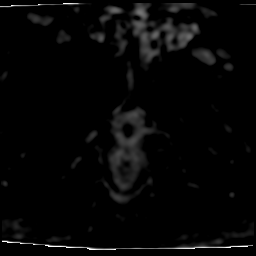

[Series 1051: eadc · axial · 3.3mm · 0.70mm/px · 1 of 25 slices shown (2 of 3)]
[im 1/25]
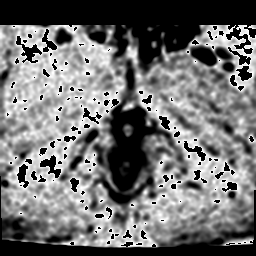

[Series 1150: ADC · axial · 3.3mm · 0.70mm/px · 1 of 25 slices shown (3 of 3)]
[im 1/25]
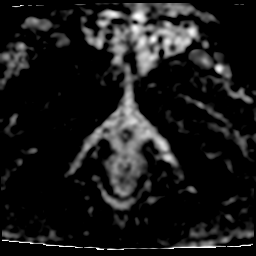

[Series 1151: eadc · axial · 3.3mm · 0.70mm/px · 1 of 25 slices shown (3 of 3)]
[im 1/25]
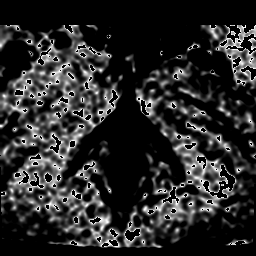

[Series 1200: fat: perfusao prostata · axial · 4.0mm · 1.09mm/px · z∈[-94,-8]mm · 11 of 880 slices shown]
[im 1/880]
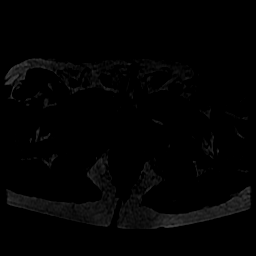
[im 63/880]
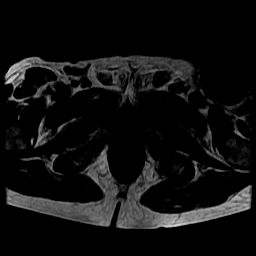
[im 126/880]
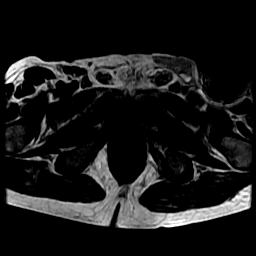
[im 189/880]
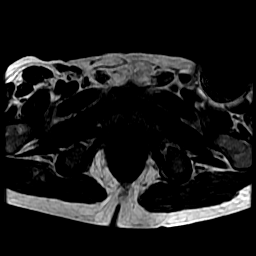
[im 252/880]
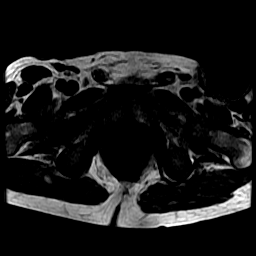
[im 314/880]
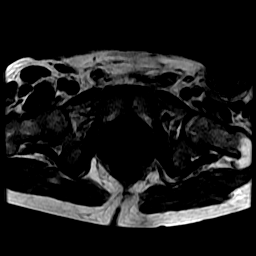
[im 377/880]
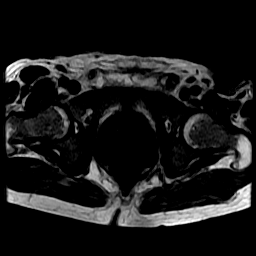
[im 503/880]
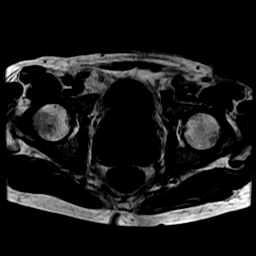
[im 628/880]
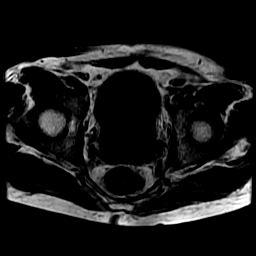
[im 754/880]
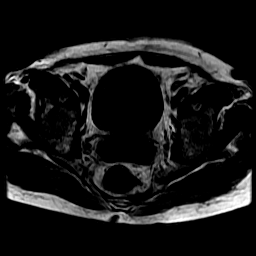
[im 880/880]
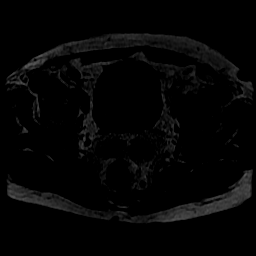

[T2 · axial · 3.3mm · 0.35mm/px · 1 of 25 slices shown (6 of 6)]
[im 1/25]
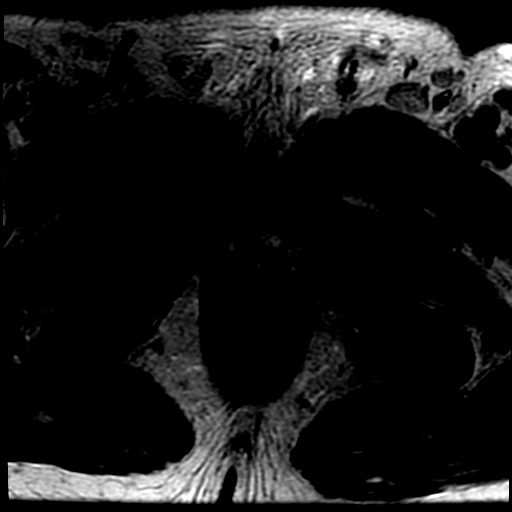

[42 of 48 positions shown; findings below may reference images not displayed]

Médico: -
TÉCNICA: 
Foram realizadas aquisições multiplanares ponderadas em T1 e T2, algumas com técnica para supressão do
sinal da gordura, antes e após a injeção do meio de contraste endovenoso, com protocolo específico para
avaliação multiparamétrica da prostática. 
ANÁLISE:
RESSONÂNCIA MAGNÉTICA MULTIPARAMÉTRICA DA PRÓSTATA
Bexiga com moderada repleção e conteúdo homogêneo, apresentando paredes difusamente espessadas. 
Próstata medindo 6,3 x 8,1 x 7,3 cm, com peso estimado em 193,7 g.
Zona de transição com sinal heterogêneo a custa de nódulos heterogêneos e encapsulados. Não há lesões
suspeitas para neoplasia significante na glândula central.
Insinuação da zona mediana sobre o assoalho vesical, com IPP medindo cerca de 2,2 cm.
Zona periférica apresentando áreas irregulares de hipossinal em T2 esparsas bilateralmente, algumas com
componente de hipersinal em T2 de permeio, com sinais de restrição a difusão e ausência de realce pelo meio
de contraste. 
A cápsula prostática possui contornos regulares.
Edema dos planos adiposos periprostáticos.
Vesículas seminais assimétricas, sendo maior à esquerda, onde apresenta paredes espessadas e conteúdo de
aspecto espesso, apresentando realce heterogêneo, associado a obliteração dos planos adiposos
perivesiculares.
Não há evidências de linfonodomegalias na escavação pélvica.
Ausência de líquido livre significativo na cavidade abdominal. 
Estruturas ósseas visualizadas sem alterações suspeitas.
IMPRESSÃO:
- Bexiga com paredes difusamente espessadas, sugerindo bexiga de esforço, não sendo possível descartar a
hipótese de processo inflamatório associado.
- Próstata com peso estimado em 193,7 g.
[facility] - [address] [place] [id number], [place]
Médico: -
- Zona de transição com sinal heterogêneo a custa de nódulos heterogêneos e encapsulados, sugerindo
hiperplasia prostática benigna (PI-RADS - v2.1: 1).
- Zona periférica com áreas heterogêneas com sinais de restrição a difusão e ausência de realce pelo meio de
contraste, associado a edema dos planos adiposos periprostáticos, vesículas seminais assimétricas, com
paredes espessadas e conteúdo de aspecto espesso e obliteração dos planos adiposos perivesiculares,
sugerindo processo inflamatório / infeccioso.
[facility] - [address] [place] [id number], [place] - [place]
